# Patient Record
Sex: Female | Born: 1940 | Race: White | Hispanic: No | Smoking: Former smoker
Health system: Southern US, Community
[De-identification: ages and names within clinical notes are randomized; demographics above are authoritative.]

## PROBLEM LIST (undated history)

## (undated) DIAGNOSIS — K7290 Hepatic failure, unspecified without coma: Secondary | ICD-10-CM

## (undated) DIAGNOSIS — R011 Cardiac murmur, unspecified: Secondary | ICD-10-CM

## (undated) DIAGNOSIS — M1990 Unspecified osteoarthritis, unspecified site: Secondary | ICD-10-CM

## (undated) DIAGNOSIS — C801 Malignant (primary) neoplasm, unspecified: Secondary | ICD-10-CM

## (undated) DIAGNOSIS — F32A Depression, unspecified: Secondary | ICD-10-CM

## (undated) DIAGNOSIS — E119 Type 2 diabetes mellitus without complications: Secondary | ICD-10-CM

## (undated) DIAGNOSIS — F329 Major depressive disorder, single episode, unspecified: Secondary | ICD-10-CM

## (undated) DIAGNOSIS — K7682 Hepatic encephalopathy: Secondary | ICD-10-CM

## (undated) DIAGNOSIS — K7581 Nonalcoholic steatohepatitis (NASH): Secondary | ICD-10-CM

## (undated) DIAGNOSIS — N189 Chronic kidney disease, unspecified: Secondary | ICD-10-CM

## (undated) DIAGNOSIS — D696 Thrombocytopenia, unspecified: Secondary | ICD-10-CM

## (undated) DIAGNOSIS — E079 Disorder of thyroid, unspecified: Secondary | ICD-10-CM

## (undated) DIAGNOSIS — K7460 Unspecified cirrhosis of liver: Secondary | ICD-10-CM

## (undated) DIAGNOSIS — E039 Hypothyroidism, unspecified: Secondary | ICD-10-CM

## (undated) HISTORY — DX: Unspecified osteoarthritis, unspecified site: M19.90

## (undated) HISTORY — DX: Type 2 diabetes mellitus without complications: E11.9

## (undated) HISTORY — DX: Nonalcoholic steatohepatitis (NASH): K75.81

## (undated) HISTORY — DX: Unspecified cirrhosis of liver: K74.60

## (undated) HISTORY — DX: Hypothyroidism, unspecified: E03.9

## (undated) HISTORY — DX: Chronic kidney disease, unspecified: N18.9

## (undated) HISTORY — PX: THORACENTESIS: SHX235

## (undated) HISTORY — PX: TONSILLECTOMY: SUR1361

## (undated) HISTORY — DX: Malignant (primary) neoplasm, unspecified: C80.1

## (undated) HISTORY — PX: APPENDECTOMY: SHX54

## (undated) HISTORY — DX: Depression, unspecified: F32.A

## (undated) HISTORY — DX: Hepatic encephalopathy: K76.82

## (undated) HISTORY — PX: PARACENTESIS: SHX844

## (undated) HISTORY — DX: Disorder of thyroid, unspecified: E07.9

## (undated) HISTORY — PX: FOOT NEUROMA SURGERY: SHX646

## (undated) HISTORY — PX: PARTIAL HYSTERECTOMY: SHX80

## (undated) HISTORY — PX: CHOLECYSTECTOMY: SHX55

## (undated) HISTORY — DX: Hepatic failure, unspecified without coma: K72.90

## (undated) HISTORY — PX: TIPS PROCEDURE: SHX808

## (undated) HISTORY — DX: Thrombocytopenia, unspecified: D69.6

## (undated) HISTORY — PX: BACK SURGERY: SHX140

## (undated) HISTORY — DX: Cardiac murmur, unspecified: R01.1

---
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
## 2018-02-28 ENCOUNTER — Encounter: Payer: Self-pay | Admitting: Internal Medicine

## 2018-02-28 LAB — HM DIABETES EYE EXAM

## 2020-01-29 ENCOUNTER — Telehealth: Payer: Self-pay | Admitting: General Practice

## 2020-01-29 NOTE — Telephone Encounter (Signed)
Dr. Tamala Julian, I[place], clark, ellison or byrum

## 2020-01-29 NOTE — Telephone Encounter (Signed)
Ashlee Kelly stated that the case was urgent and nobody has a 30 min consult opening until later next week. Dr. Lamonte Sakai has some held spots for nodules. Could we place the pt in one of these slots? RB please advise.

## 2020-01-29 NOTE — Telephone Encounter (Signed)
Who should I schedule pt with? Please advise.

## 2020-01-29 NOTE — Telephone Encounter (Signed)
Spoke with Ashlee Kelly and she states pt needs urgent referral to be seen for pleural effusion. She is waiting for the provider to finish his office notes and she is going to fax all the information to [place].

## 2020-01-30 DIAGNOSIS — D689 Coagulation defect, unspecified: Secondary | ICD-10-CM | POA: Insufficient documentation

## 2020-01-30 DIAGNOSIS — G8929 Other chronic pain: Secondary | ICD-10-CM | POA: Insufficient documentation

## 2020-01-30 DIAGNOSIS — Z87891 Personal history of nicotine dependence: Secondary | ICD-10-CM | POA: Insufficient documentation

## 2020-01-30 DIAGNOSIS — J90 Pleural effusion, not elsewhere classified: Secondary | ICD-10-CM | POA: Insufficient documentation

## 2020-01-30 DIAGNOSIS — E119 Type 2 diabetes mellitus without complications: Secondary | ICD-10-CM | POA: Insufficient documentation

## 2020-01-30 DIAGNOSIS — D696 Thrombocytopenia, unspecified: Secondary | ICD-10-CM | POA: Insufficient documentation

## 2020-01-30 DIAGNOSIS — E039 Hypothyroidism, unspecified: Secondary | ICD-10-CM | POA: Insufficient documentation

## 2020-01-30 DIAGNOSIS — K7460 Unspecified cirrhosis of liver: Secondary | ICD-10-CM | POA: Insufficient documentation

## 2020-01-30 DIAGNOSIS — K589 Irritable bowel syndrome without diarrhea: Secondary | ICD-10-CM | POA: Insufficient documentation

## 2020-01-30 DIAGNOSIS — R188 Other ascites: Secondary | ICD-10-CM | POA: Insufficient documentation

## 2020-01-31 ENCOUNTER — Ambulatory Visit: Payer: Self-pay | Admitting: Internal Medicine

## 2020-02-04 NOTE — Telephone Encounter (Signed)
ATC patient to see if she wanted to come in tomorrow at 10am with Dr. Tamala Julian or if she wanted to keep her appointment with Dr. Carlis Abbott on the 11th unable to leave VM line just rang and then went to busy signal.  If patient calls back please see if she wants to come in sooner or keep appointment with Dr. Carlis Abbott on 3/11 at 3pm.

## 2020-02-05 MED ORDER — DM-GUAIFENESIN ER 30-600 MG PO TB12
1.00 | ORAL_TABLET | ORAL | Status: DC
Start: 2020-02-12 — End: 2020-02-05

## 2020-02-05 MED ORDER — IPRATROPIUM-ALBUTEROL 0.5-2.5 (3) MG/3ML IN SOLN
3.00 | RESPIRATORY_TRACT | Status: DC
Start: ? — End: 2020-02-05

## 2020-02-05 MED ORDER — BISACODYL 10 MG RE SUPP
10.00 | RECTAL | Status: DC
Start: ? — End: 2020-02-05

## 2020-02-05 MED ORDER — HYDRALAZINE HCL 10 MG PO TABS
10.00 | ORAL_TABLET | ORAL | Status: DC
Start: ? — End: 2020-02-05

## 2020-02-05 MED ORDER — DSS 100 MG PO CAPS
100.00 | ORAL_CAPSULE | ORAL | Status: DC
Start: ? — End: 2020-02-05

## 2020-02-05 MED ORDER — LEVOTHYROXINE SODIUM 75 MCG PO TABS
150.00 | ORAL_TABLET | ORAL | Status: DC
Start: 2020-02-12 — End: 2020-02-05

## 2020-02-05 MED ORDER — OXYCODONE HCL 5 MG PO TABS
5.00 | ORAL_TABLET | ORAL | Status: DC
Start: ? — End: 2020-02-05

## 2020-02-05 MED ORDER — INSULIN LISPRO 100 UNIT/ML ~~LOC~~ SOLN
2.00 | SUBCUTANEOUS | Status: DC
Start: 2020-02-12 — End: 2020-02-05

## 2020-02-05 MED ORDER — SODIUM CHLORIDE 0.9 % IV SOLN
10.00 | INTRAVENOUS | Status: DC
Start: ? — End: 2020-02-05

## 2020-02-05 MED ORDER — TORSEMIDE 20 MG PO TABS
20.00 | ORAL_TABLET | ORAL | Status: DC
Start: 2020-02-06 — End: 2020-02-05

## 2020-02-05 MED ORDER — HYDROCODONE-HOMATROPINE 5-1.5 MG/5ML PO SYRP
5.00 | ORAL_SOLUTION | ORAL | Status: DC
Start: ? — End: 2020-02-05

## 2020-02-05 MED ORDER — GENERIC EXTERNAL MEDICATION
Status: DC
Start: ? — End: 2020-02-05

## 2020-02-05 MED ORDER — ALBUTEROL SULFATE HFA 108 (90 BASE) MCG/ACT IN AERS
2.00 | INHALATION_SPRAY | RESPIRATORY_TRACT | Status: DC
Start: ? — End: 2020-02-05

## 2020-02-05 MED ORDER — CITALOPRAM HYDROBROMIDE 20 MG PO TABS
40.00 | ORAL_TABLET | ORAL | Status: DC
Start: 2020-02-06 — End: 2020-02-05

## 2020-02-05 MED ORDER — NITROGLYCERIN 0.4 MG SL SUBL
0.40 | SUBLINGUAL_TABLET | SUBLINGUAL | Status: DC
Start: ? — End: 2020-02-05

## 2020-02-05 MED ORDER — SPIRONOLACTONE 25 MG PO TABS
50.00 | ORAL_TABLET | ORAL | Status: DC
Start: 2020-02-06 — End: 2020-02-05

## 2020-02-05 MED ORDER — LIDOCAINE HCL 2 % IJ SOLN
10.00 | INTRAMUSCULAR | Status: DC
Start: 2020-02-05 — End: 2020-02-05

## 2020-02-05 MED ORDER — ALLOPURINOL 100 MG PO TABS
100.00 | ORAL_TABLET | ORAL | Status: DC
Start: 2020-02-13 — End: 2020-02-05

## 2020-02-05 MED ORDER — OXYCODONE HCL 10 MG PO TABS
10.00 | ORAL_TABLET | ORAL | Status: DC
Start: ? — End: 2020-02-05

## 2020-02-05 MED ORDER — AMLODIPINE BESYLATE 5 MG PO TABS
5.00 | ORAL_TABLET | ORAL | Status: DC
Start: 2020-02-13 — End: 2020-02-05

## 2020-02-05 NOTE — Telephone Encounter (Signed)
Pt is currently admitted with [facility], per [place]. Will await pts discharge to schedule appt.

## 2020-02-06 MED ORDER — GENERIC EXTERNAL MEDICATION
Status: DC
Start: ? — End: 2020-02-06

## 2020-02-06 MED ORDER — INSULIN GLARGINE 100 UNIT/ML ~~LOC~~ SOLN
10.00 | SUBCUTANEOUS | Status: DC
Start: 2020-02-06 — End: 2020-02-06

## 2020-02-06 MED ORDER — CARBAMIDE PEROXIDE 6.5 % OT SOLN
5.00 | OTIC | Status: DC
Start: 2020-02-12 — End: 2020-02-06

## 2020-02-07 ENCOUNTER — Institutional Professional Consult (permissible substitution): Payer: Medicare Other | Admitting: Critical Care Medicine

## 2020-02-07 NOTE — Telephone Encounter (Signed)
Called and spoke to pt. Pt states she is still currently admitted, Care Everywhere also reflects this. Pt states she wants to wait till she is discharged to make an appt. Will call pt in 2-3 days, as pt states she will be in for another few days. Will keep message open to follow up on.

## 2020-02-12 ENCOUNTER — Telehealth: Payer: Self-pay | Admitting: Critical Care Medicine

## 2020-02-12 MED ORDER — INSULIN GLARGINE 100 UNIT/ML ~~LOC~~ SOLN
20.00 | SUBCUTANEOUS | Status: DC
Start: 2020-02-12 — End: 2020-02-12

## 2020-02-12 MED ORDER — GENERIC EXTERNAL MEDICATION
Status: DC
Start: ? — End: 2020-02-12

## 2020-02-12 MED ORDER — SODIUM CHLORIDE 0.9 % IV SOLN
10.00 | INTRAVENOUS | Status: DC
Start: ? — End: 2020-02-12

## 2020-02-12 MED ORDER — HYDROMORPHONE HCL 1 MG/ML IJ SOLN
0.50 | INTRAMUSCULAR | Status: DC
Start: ? — End: 2020-02-12

## 2020-02-12 MED ORDER — LACTULOSE 10 GM/15ML PO SOLN
20.00 | ORAL | Status: DC
Start: 2020-02-12 — End: 2020-02-12

## 2020-02-12 MED ORDER — ONDANSETRON HCL 4 MG/2ML IJ SOLN
4.00 | INTRAMUSCULAR | Status: DC
Start: ? — End: 2020-02-12

## 2020-02-12 NOTE — Telephone Encounter (Signed)
While reviewing the chart, the patient was scheduled but it was cancelled because she was hospitalized. I called [facility] urgent care to let them know and spoke with [facility]. Nothing further is needed.

## 2020-02-15 DIAGNOSIS — I82A11 Acute embolism and thrombosis of right axillary vein: Secondary | ICD-10-CM | POA: Insufficient documentation

## 2020-03-11 DIAGNOSIS — I82A21 Chronic embolism and thrombosis of right axillary vein: Secondary | ICD-10-CM | POA: Insufficient documentation

## 2020-03-11 DIAGNOSIS — N184 Chronic kidney disease, stage 4 (severe): Secondary | ICD-10-CM | POA: Insufficient documentation

## 2020-03-11 DIAGNOSIS — D649 Anemia, unspecified: Secondary | ICD-10-CM | POA: Insufficient documentation

## 2020-03-11 DIAGNOSIS — R413 Other amnesia: Secondary | ICD-10-CM | POA: Insufficient documentation

## 2020-03-17 NOTE — Progress Notes (Signed)
Subjective:    Patient ID: Ashlee Kelly, female    DOB: 04-10-1941, 79 y.o.   MRN: [id number]  HPI  She is here to establish with a new pcp.  The patient is here for follow up of their chronic medical problems  She was admitted to [facility] for shortness of breath and cough on 3/2.  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  She had thoracentesis, pleuracentesis and a tips procedure.  She developed RUE DVT in subclavian, axillary, brachial veins from a PICC line.  She saw hematology and a/c not needed due to high risk from liver dx, coagulopathy and thrombocytopenia.    She was discharged to rehab and went home two days ago.    She has gained 4 lbs since leaving rehab.  She feels SOB at rest and with activity.  She feels more SOB today.    Her right arm is weeping clear fluid and is swollen.  The swelling has improved. This all started after the dvt.         Medications and allergies reviewed with patient and updated if appropriate.  Patient Active Problem List   Diagnosis Date Noted  . History of gout 03/18/2020  . Anxiety and depression 03/18/2020  . SOB (shortness of breath) 03/18/2020  . CKD (chronic kidney disease) stage 4, GFR 15-29 ml/min (HCC) 03/11/2020  . DVT of axillary vein, chronic right ([place]) 03/11/2020  . Memory deficit 03/11/2020  . Acute deep vein thrombosis (DVT) of axillary vein of right upper extremity ([place]) 02/15/2020  . Chronic bilateral low back pain 01/30/2020  . Coagulopathy ([place]) 01/30/2020  . History of tobacco abuse 01/30/2020  . Hypothyroidism 01/30/2020  . IBS (irritable bowel syndrome) 01/30/2020  . Cirrhosis of liver with ascites ([place]) 01/30/2020  . Type 2 diabetes mellitus, without long-term current use of insulin ([place]) 01/30/2020  . Pleural effusion on right 01/30/2020    Current Outpatient Medications on File Prior to Visit  Medication Sig Dispense Refill  . allopurinol (ZYLOPRIM) 100 MG tablet Take 100 mg by mouth  daily.    . ciprofloxacin (CIPRO) 250 MG tablet Take 250 mg by mouth 2 (two) times daily.    . insulin glargine (LANTUS) 100 UNIT/ML injection Inject 30 Units into the skin daily. 30 units at night    . lactulose (CEPHULAC) 10 g packet Take 10 g by mouth 3 (three) times daily.    Marland Kitchen levothyroxine (SYNTHROID) 150 MCG tablet Take 150 mcg by mouth daily before breakfast.    . spironolactone (ALDACTONE) 50 MG tablet Take 50 mg by mouth daily.    Marland Kitchen torsemide (DEMADEX) 20 MG tablet Take 20 mg by mouth daily.     No current facility-administered medications on file prior to visit.    History reviewed. No pertinent past medical history.  History reviewed. No pertinent surgical history.  Social History   Socioeconomic History  . Marital status: Unknown    Spouse name: Not on file  . Number of children: Not on file  . Years of education: Not on file  . Highest education level: Not on file  Occupational History  . Not on file  Tobacco Use  . Smoking status: Former Research scientist (life sciences)  . Smokeless tobacco: Never Used  Substance and Sexual Activity  . Alcohol use: Not Currently  . Drug use: Not Currently  . Sexual activity: Not on file  Other Topics Concern  . Not on file  Social History Narrative  .  Not on file   Social Determinants of Health   Financial Resource Strain:   . Difficulty of Paying Living Expenses:   Food Insecurity:   . Worried About Charity fundraiser in the Last Year:   . Arboriculturist in the Last Year:   Transportation Needs:   . Film/video editor (Medical):   Marland Kitchen Lack of Transportation (Non-Medical):   Physical Activity:   . Days of Exercise per Week:   . Minutes of Exercise per Session:   Stress:   . Feeling of Stress :   Social Connections:   . Frequency of Communication with Friends and Family:   . Frequency of Social Gatherings with Friends and Family:   . Attends Religious Services:   . Active Member of Clubs or Organizations:   . Attends Theatre manager Meetings:   Marland Kitchen Marital Status:     History reviewed. No pertinent family history.  Review of Systems  Constitutional: Positive for appetite change (decreased). Negative for chills and fever.  Respiratory: Positive for cough (dry) and shortness of breath. Negative for wheezing.   Cardiovascular: Positive for leg swelling. Negative for chest pain and palpitations.  Gastrointestinal: Positive for abdominal pain, diarrhea and nausea.  Neurological: Positive for dizziness, light-headedness and numbness (toes). Negative for headaches.  Psychiatric/Behavioral: Positive for dysphoric mood and sleep disturbance. The patient is nervous/anxious.        Objective:   Vitals:   03/18/20 1304  BP: 124/62  Pulse: (!) 103  Resp: 14  Temp: 98.7 F (37.1 C)  SpO2: 96%   BP Readings from Last 3 Encounters:  03/18/20 124/62   Wt Readings from Last 3 Encounters:  03/18/20 158 lb 9.6 oz (71.9 kg)   Body mass index is 25.6 kg/m.   Physical Exam    Constitutional: Appears well-developed and well-nourished. No distress.  HENT:  Head: Normocephalic and atraumatic.  Neck: Neck supple. No tracheal deviation present. No thyromegaly present.  No cervical lymphadenopathy Cardiovascular: Normal rate, regular rhythm and normal heart sounds.  3/6 systolic murmur heard.  Mild b/l LE edema, mild edema RUE, minimal leakage of clear fluid lower right arm Pulmonary/Chest: Effort normal.  Decreased breath sounds entire right lung field.  No respiratory distress. No has no wheezes. No rales.  Abdomen: soft, distended, diffusely tender w/o rebound or guarding Skin: Skin is warm and dry. Not diaphoretic.  Psychiatric: Normal mood and affect. Behavior is normal.      Assessment & Plan:   55 minutes were spent face-to-face with the patient and her son, reviewing her chart, medical history, numerous medical problems and coordinating care.      See Problem List for Assessment and Plan of chronic  medical problems.    This visit occurred during the SARS-CoV-2 public health emergency.  Safety protocols were in place, including screening questions prior to the visit, additional usage of staff PPE, and extensive cleaning of exam room while observing appropriate contact time as indicated for disinfecting solutions.

## 2020-03-18 ENCOUNTER — Encounter: Payer: Self-pay | Admitting: Internal Medicine

## 2020-03-18 ENCOUNTER — Ambulatory Visit (INDEPENDENT_AMBULATORY_CARE_PROVIDER_SITE_OTHER): Payer: Medicare Other

## 2020-03-18 ENCOUNTER — Ambulatory Visit (INDEPENDENT_AMBULATORY_CARE_PROVIDER_SITE_OTHER): Payer: Medicare Other | Admitting: Internal Medicine

## 2020-03-18 ENCOUNTER — Other Ambulatory Visit: Payer: Self-pay

## 2020-03-18 DIAGNOSIS — Z8739 Personal history of other diseases of the musculoskeletal system and connective tissue: Secondary | ICD-10-CM

## 2020-03-18 DIAGNOSIS — I340 Nonrheumatic mitral (valve) insufficiency: Secondary | ICD-10-CM | POA: Insufficient documentation

## 2020-03-18 DIAGNOSIS — F419 Anxiety disorder, unspecified: Secondary | ICD-10-CM

## 2020-03-18 DIAGNOSIS — J90 Pleural effusion, not elsewhere classified: Secondary | ICD-10-CM

## 2020-03-18 DIAGNOSIS — K7581 Nonalcoholic steatohepatitis (NASH): Secondary | ICD-10-CM

## 2020-03-18 DIAGNOSIS — R0602 Shortness of breath: Secondary | ICD-10-CM

## 2020-03-18 DIAGNOSIS — I82A21 Chronic embolism and thrombosis of right axillary vein: Secondary | ICD-10-CM

## 2020-03-18 DIAGNOSIS — N184 Chronic kidney disease, stage 4 (severe): Secondary | ICD-10-CM

## 2020-03-18 DIAGNOSIS — E039 Hypothyroidism, unspecified: Secondary | ICD-10-CM

## 2020-03-18 DIAGNOSIS — Z95828 Presence of other vascular implants and grafts: Secondary | ICD-10-CM | POA: Insufficient documentation

## 2020-03-18 DIAGNOSIS — F329 Major depressive disorder, single episode, unspecified: Secondary | ICD-10-CM

## 2020-03-18 DIAGNOSIS — I350 Nonrheumatic aortic (valve) stenosis: Secondary | ICD-10-CM | POA: Insufficient documentation

## 2020-03-18 DIAGNOSIS — K7460 Unspecified cirrhosis of liver: Secondary | ICD-10-CM

## 2020-03-18 DIAGNOSIS — K589 Irritable bowel syndrome without diarrhea: Secondary | ICD-10-CM

## 2020-03-18 MED ORDER — SERTRALINE HCL 50 MG PO TABS: 50.0000 mg | ORAL_TABLET | Freq: Every day | ORAL | 5 refills | Status: DC

## 2020-03-18 NOTE — Assessment & Plan Note (Signed)
Acute With residual RUE edema and clear fluid leakage from lower right arm Heme consulted - no a/c due to liver dx, coagulopathy and thrombocytopenia There has been some improvement in the edema

## 2020-03-18 NOTE — Assessment & Plan Note (Signed)
Chronic Taking allopurinol daily No gout symptoms since start allopurinol continue

## 2020-03-18 NOTE — Assessment & Plan Note (Signed)
Chronic Stable Would like to establish with nephrology in [place] - referred to [facility]

## 2020-03-18 NOTE — Assessment & Plan Note (Addendum)
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

## 2020-03-18 NOTE — Patient Instructions (Addendum)
  A chest xray was ordered.     Medications reviewed and updated.  Changes include :   Decrease celexa to 20 mg daily for 5 days and then stop. Start sertraline 50 mg at night.   Your prescription(s) have been submitted to your pharmacy. Please take as directed and contact our office if you believe you are having problem(s) with the medication(s).  A referral was ordered for  GI, pulmonary, kidney doctor.     Someone will call you to schedule this.    Please followup in 1 month

## 2020-03-18 NOTE — Assessment & Plan Note (Signed)
Related to liver cirrhosis Recurrent S/p tips procedure 01/2020 Decreased BS entire right lung, increased SOB in past 24 hrs CXR today

## 2020-03-18 NOTE — Assessment & Plan Note (Signed)
Chronic Continue current dose of levothyroxine

## 2020-03-18 NOTE — Assessment & Plan Note (Signed)
Chronic Taking lantus 30 units HS Sugars controlled Continue above

## 2020-03-18 NOTE — Assessment & Plan Note (Addendum)
Chronic With diarrhea Typically has loose BMs daily

## 2020-03-18 NOTE — Assessment & Plan Note (Signed)
Acute Increased since leaving rehab ? Reacummulation of pleural effusion - decreased BS entire right lung CXR today

## 2020-03-19 ENCOUNTER — Ambulatory Visit: Payer: Medicare Other | Admitting: Nurse Practitioner

## 2020-03-19 ENCOUNTER — Encounter: Payer: Self-pay | Admitting: Internal Medicine

## 2020-03-19 MED ORDER — PAROXETINE HCL 20 MG PO TABS: 20.0000 mg | ORAL_TABLET | Freq: Every day | ORAL | 5 refills | Status: DC

## 2020-03-20 ENCOUNTER — Ambulatory Visit (INDEPENDENT_AMBULATORY_CARE_PROVIDER_SITE_OTHER): Payer: Medicare Other | Admitting: Nurse Practitioner

## 2020-03-20 ENCOUNTER — Other Ambulatory Visit: Payer: Self-pay

## 2020-03-20 ENCOUNTER — Encounter: Payer: Self-pay | Admitting: *Deleted

## 2020-03-20 ENCOUNTER — Telehealth: Payer: Self-pay | Admitting: Internal Medicine

## 2020-03-20 ENCOUNTER — Telehealth: Payer: Self-pay | Admitting: Emergency Medicine

## 2020-03-20 ENCOUNTER — Encounter: Payer: Self-pay | Admitting: Nurse Practitioner

## 2020-03-20 VITALS — BP 132/74 | HR 99 | Temp 96.8°F | Ht 66.0 in | Wt 160.4 lb

## 2020-03-20 DIAGNOSIS — K7460 Unspecified cirrhosis of liver: Secondary | ICD-10-CM | POA: Diagnosis not present

## 2020-03-20 DIAGNOSIS — D696 Thrombocytopenia, unspecified: Secondary | ICD-10-CM

## 2020-03-20 DIAGNOSIS — R188 Other ascites: Secondary | ICD-10-CM

## 2020-03-20 DIAGNOSIS — K766 Portal hypertension: Secondary | ICD-10-CM | POA: Diagnosis not present

## 2020-03-20 MED ORDER — RIFAXIMIN 550 MG PO TABS: 550.0000 mg | ORAL_TABLET | Freq: Two times a day (BID) | ORAL | 0 refills | Status: DC

## 2020-03-20 MED ORDER — ONDANSETRON HCL 4 MG PO TABS: 4.0000 mg | ORAL_TABLET | Freq: Three times a day (TID) | ORAL | 2 refills | Status: AC | PRN

## 2020-03-20 MED ORDER — HYDROXYZINE HCL 10 MG PO TABS: 10.0000 mg | ORAL_TABLET | Freq: Every evening | ORAL | 0 refills | Status: DC | PRN

## 2020-03-20 NOTE — Telephone Encounter (Signed)
Pt was seen today by EG. Her son, Laverna Peace, called this afternoon to let EG know that her prescription after Insurance would be over $600 and was there anything else we could recommend. Please advise. [phone]

## 2020-03-20 NOTE — Telephone Encounter (Signed)
Called medicare and pt will need to give info from her RX savings card. Spoke with pts son. RXBIN [id number], PCN [id number], GP RXMEDDI ID [id number]. I called the number pts son gave [phone] and it was the wrong number. I was transferred several times and given the number [phone]. I will try this number. Pts medication was approved and still cost $600.00. I'm trying to get a tier reduction.

## 2020-03-20 NOTE — Telephone Encounter (Signed)
PA approved  rx number [id number] Pt notified

## 2020-03-20 NOTE — Patient Instructions (Signed)
Your health issues we discussed today were:   Cirrhosis and effects of cirrhosis: 1. I have sent Atarax 10 mg to your pharmacy.  Take this every evening, if needed for itching 2. Have your labs drawn when you are able to 3. We will help schedule your ultrasound your liver for you 4. I sent a prescription for Xifaxan (rifaximin) 550 mg to your pharmacy.  Take this twice a day to prevent confusion 5. While we are trying to get Xifaxan approved, continue using lactulose as well as you are able to with your loose stools 6. I will discuss with Dr. Gala Romney if we can proceed with an upper endoscopy to screen for swollen blood vessels in your swallowing tube 7. Call us if you have any worsening or significant symptoms  Nausea: 1. I have sent a prescription for Zofran 4 mg to your pharmacy.  Take this every 8 hours, if needed for nausea 2. Call us if you have any worsening or significant nausea with or without vomiting  Overall I recommend:  1. Continue your other current medications 2. Return for follow-up in 3 months 3. Call us if you have any questions or concerns   ---------------------------------------------------------------  COVID-19 Vaccine Information can be found at: ShippingScam.co.uk For questions related to vaccine distribution or appointments, please email vaccine@[email or web address] .com or call 5034984662.   ---------------------------------------------------------------   At [facility] we value your feedback. You may receive a survey about your visit today. Please share your experience as we strive to create trusting relationships with our patients to provide genuine, compassionate, quality care.  We appreciate your understanding and patience as we review any laboratory studies, imaging, and other diagnostic tests that are ordered as we care for you. Our office policy is 5 business days for review of these  results, and any emergent or urgent results are addressed in a timely manner for your best interest. If you do not hear from our office in 1 week, please contact us.   We also encourage the use of MyChart, which contains your medical information for your review as well. If you are not enrolled in this feature, an access code is on this after visit summary for your convenience. Thank you for allowing [place] to be involved in your care.  It was great to see you today!  I hope you have a great Summer!!

## 2020-03-20 NOTE — Progress Notes (Signed)
Primary Care Physician:  Binnie Rail, MD Primary Gastroenterologist:  Dr. Gala Romney  Chief Complaint  Patient presents with  . varices    HPI:   Ashlee Kelly is a 79 y.o. 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  Reviewed information provided with referral including a recent office visit with a newly established primary care on 03/18/2020.  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  Thoracentesis and pleurocentesis as well as TIPS procedure performed.  Developed DVT in the process in the right upper extremity from a PICC line.  Hematology recommended no anticoagulation due to high risk from liver disease, coagulopathy, and thrombocytopenia.  She was discharged to rehab.  She has gained 4 pounds since leaving rehab and has shortness of breath at rest.  Right upper extremity swelling improved.  Questioning of acute worsening/reaccumulation of pleural effusion and recommended chest x-ray.  Noted chronic kidney disease and recommended establishment with a nephrologist in [place].  Also noted chronic IBS with loose BMs daily.  TIPS procedure performed 01/2020.  Noted some improvement in DVT.  Regarding cirrhosis, noted to be chronic due to [place] with ascites.  On Cipro for SBP prophylaxis.  On lactulose 3 times daily for 3-4 stools a day, no issues with ammonia.  Noted GI appointment this week but would ideally like to see somebody at [facility].  Chest x-ray showed moderate to large right-sided pleural effusion.  Noted to be seen pulmonary this week.  The patient had an office visit 03/19/2020 for TIPS evaluation, noted to be patent.  Recommended follow-up in 4 months for reevaluation of TIPS to ensure patency.  No history of colonoscopy or endoscopy in our system.  Today she states she's doing ok overall. She is accompanied by her son Laverna Peace. She has known she has cirrhosis for about 6 years. Previously under the care of  Dr. Posey Pronto in [place], [place]. Moved to [place] early this year. She was previously seen every 6 months for imaging and labs. States he told her her liver disease was doing ok, not significantly worse. Went to [facility] 01/30/20 for dyspnea. Had fluid removed from around her lungs. Has had a paracentesis bout 2 years ago in [place]. She has had some minimal edema in LE, some mild swelling across her abdomen. Denies overt confusion other than some recall issues. She is on lactulose but has paused this a few days ago due to loose stools. Her son notes a lot of confusion during hospitalization, but was doing ok before and after hospitalization; he feels she's at mental status baseline now. Felt antidepressant was causing some of the issues. Has some intermittent abdominal pain ("if you push on it.") Has regular nausea but no vomiting. Denies hematochezia (although states it "almost looked like blood one or two times a few days ago, but none since"). States she had bad pruritis during hospitalization, somewhat improved after discharge but persistent to an extent. Denies melena, fever, chills, unintentional weight loss. Denies yellowing of skin/eyes, darkened urine, tremors/shakes.  States labs were just checked 2 days ago by Nephrology.   Of note, a total of 60-75 minutes were personally spent in the care of the patient today  Past Medical History:  Diagnosis Date  . Arthritis   . Cancer ([place])   . Chronic kidney disease   . Cirrhosis ([place])   . Depression   . Diabetes ([place])   . Heart murmur   .  Hepatic encephalopathy ([place])   . Hypothyroidism   . NASH (nonalcoholic steatohepatitis)   . Thrombocytopenia ([place])   . Thyroid disease     Past Surgical History:  Procedure Laterality Date  . APPENDECTOMY    . BACK SURGERY     x3  . CHOLECYSTECTOMY    . FOOT NEUROMA SURGERY Bilateral   . PARACENTESIS    . PARTIAL HYSTERECTOMY    . THORACENTESIS    . TIPS PROCEDURE    . TONSILLECTOMY      Current Outpatient  Medications  Medication Sig Dispense Refill  . allopurinol (ZYLOPRIM) 100 MG tablet Take 100 mg by mouth daily.    . ciprofloxacin (CIPRO) 250 MG tablet Take 250 mg by mouth daily with breakfast.     . insulin glargine (LANTUS) 100 UNIT/ML injection Inject 30 Units into the skin daily. 30 units at night    . lactulose (CEPHULAC) 10 g packet Take 10 g by mouth daily. Hasn't taken since 03/15/20 d/t loose stool    . levothyroxine (SYNTHROID) 150 MCG tablet Take 150 mcg by mouth daily before breakfast.    . PARoxetine (PAXIL) 20 MG tablet Take 1 tablet (20 mg total) by mouth daily. 30 tablet 5  . spironolactone (ALDACTONE) 50 MG tablet Take 50 mg by mouth daily.    Marland Kitchen torsemide (DEMADEX) 20 MG tablet Take 20 mg by mouth daily.    . hydrOXYzine (ATARAX/VISTARIL) 10 MG tablet Take 1 tablet (10 mg total) by mouth at bedtime as needed for itching. 30 tablet 0  . ondansetron (ZOFRAN) 4 MG tablet Take 1 tablet (4 mg total) by mouth every 8 (eight) hours as needed for nausea. 90 tablet 2   No current facility-administered medications for this visit.    Allergies as of 03/20/2020 - Review Complete 03/18/2020  Allergen Reaction Noted  . Celebrex [celecoxib]  03/18/2020    Family History  Problem Relation Age of Onset  . Lung cancer Father     Social History   Socioeconomic History  . Marital status: Widowed    Spouse name: Not on file  . Number of children: Not on file  . Years of education: Not on file  . Highest education level: Not on file  Occupational History  . Not on file  Tobacco Use  . Smoking status: Current Some Day Smoker    Types: Cigarettes  . Smokeless tobacco: Never Used  . Tobacco comment: Pt. states that she smokes 1 cigg. occ.   Substance and Sexual Activity  . Alcohol use: Not Currently  . Drug use: Not Currently  . Sexual activity: Not on file  Other Topics Concern  . Not on file  Social History Narrative  . Not on file   Social Determinants of Health    Financial Resource Strain:   . Difficulty of Paying Living Expenses:   Food Insecurity:   . Worried About Charity fundraiser in the Last Year:   . Arboriculturist in the Last Year:   Transportation Needs:   . Film/video editor (Medical):   Marland Kitchen Lack of Transportation (Non-Medical):   Physical Activity:   . Days of Exercise per Week:   . Minutes of Exercise per Session:   Stress:   . Feeling of Stress :   Social Connections:   . Frequency of Communication with Friends and Family:   . Frequency of Social Gatherings with Friends and Family:   . Attends Religious Services:   . Active  Member of Clubs or Organizations:   . Attends Archivist Meetings:   Marland Kitchen Marital Status:   Intimate Partner Violence:   . Fear of Current or Ex-Partner:   . Emotionally Abused:   Marland Kitchen Physically Abused:   . Sexually Abused:     Subjective: Review of Systems  Constitutional: Negative for chills, fever, malaise/fatigue and weight loss.  HENT: Negative for congestion and sore throat.   Respiratory: Negative for cough and shortness of breath.   Cardiovascular: Negative for chest pain and palpitations.  Gastrointestinal: Negative for abdominal pain, blood in stool, diarrhea, melena, nausea and vomiting.  Musculoskeletal: Negative for joint pain and myalgias.  Skin: Negative for rash.  Neurological: Negative for dizziness and weakness.  Endo/Heme/Allergies: Does not bruise/bleed easily.  Psychiatric/Behavioral: Negative for depression. The patient is not nervous/anxious.   All other systems reviewed and are negative.      Objective: BP 132/74   Pulse 99   Temp (!) 96.8 F (36 C) (Temporal)   Ht 5' 6"  (1.676 m)   Wt 160 lb 6.4 oz (72.8 kg)   BMI 25.89 kg/m  Physical Exam Vitals and nursing note reviewed.  Constitutional:      General: She is not in acute distress.    Appearance: Normal appearance. She is well-developed. She is not ill-appearing, toxic-appearing or diaphoretic.   HENT:     Head: Normocephalic and atraumatic.     Nose: No congestion or rhinorrhea.  Eyes:     General: No scleral icterus. Cardiovascular:     Rate and Rhythm: Normal rate and regular rhythm.     Heart sounds: Normal heart sounds.  Pulmonary:     Effort: Pulmonary effort is normal. No respiratory distress.     Breath sounds: Normal breath sounds.  Abdominal:     General: Bowel sounds are normal.     Palpations: Abdomen is soft. There is no hepatomegaly, splenomegaly or mass.     Tenderness: There is no abdominal tenderness. There is no guarding or rebound.     Hernia: No hernia is present.  Skin:    General: Skin is warm and dry.     Coloration: Skin is not jaundiced.     Findings: No rash.  Neurological:     General: No focal deficit present.     Mental Status: She is alert and oriented to person, place, and time.  Psychiatric:        Attention and Perception: Attention normal.        Mood and Affect: Mood normal.        Speech: Speech normal.        Behavior: Behavior normal.        Thought Content: Thought content normal.        Cognition and Memory: Cognition and memory normal.       03/30/2020 9:30 PM   Disclaimer: This note was dictated with voice recognition software. Similar sounding words can inadvertently be transcribed and may not be corrected upon review.

## 2020-03-21 ENCOUNTER — Ambulatory Visit (INDEPENDENT_AMBULATORY_CARE_PROVIDER_SITE_OTHER): Payer: Medicare Other | Admitting: Pulmonary Disease

## 2020-03-21 ENCOUNTER — Encounter: Payer: Self-pay | Admitting: Pulmonary Disease

## 2020-03-21 VITALS — BP 130/80 | HR 57 | Ht 66.0 in | Wt 160.0 lb

## 2020-03-21 DIAGNOSIS — R0602 Shortness of breath: Secondary | ICD-10-CM

## 2020-03-21 DIAGNOSIS — J90 Pleural effusion, not elsewhere classified: Secondary | ICD-10-CM

## 2020-03-21 LAB — CBC WITH DIFFERENTIAL/PLATELET
Absolute Monocytes: 400 cells/uL (ref 200–950)
Basophils Absolute: 62 cells/uL (ref 0–200)
Basophils Relative: 1.2 %
Eosinophils Absolute: 229 cells/uL (ref 15–500)
Eosinophils Relative: 4.4 %
HCT: 29.7 % — ABNORMAL LOW (ref 35.0–45.0)
Hemoglobin: 10.2 g/dL — ABNORMAL LOW (ref 11.7–15.5)
Lymphs Abs: 842 cells/uL — ABNORMAL LOW (ref 850–3900)
MCH: 32.1 pg (ref 27.0–33.0)
MCHC: 34.3 g/dL (ref 32.0–36.0)
MCV: 93.4 fL (ref 80.0–100.0)
MPV: 10.7 fL (ref 7.5–12.5)
Monocytes Relative: 7.7 %
Neutro Abs: 3666 cells/uL (ref 1500–7800)
Neutrophils Relative %: 70.5 %
Platelets: 58 10*3/uL — ABNORMAL LOW (ref 140–400)
RBC: 3.18 10*6/uL — ABNORMAL LOW (ref 3.80–5.10)
RDW: 16.3 % — ABNORMAL HIGH (ref 11.0–15.0)
Total Lymphocyte: 16.2 %
WBC: 5.2 10*3/uL (ref 3.8–10.8)

## 2020-03-21 LAB — COMPREHENSIVE METABOLIC PANEL
AG Ratio: 1 (calc) (ref 1.0–2.5)
ALT: 30 U/L — ABNORMAL HIGH (ref 6–29)
AST: 47 U/L — ABNORMAL HIGH (ref 10–35)
Albumin: 2.6 g/dL — ABNORMAL LOW (ref 3.6–5.1)
Alkaline phosphatase (APISO): 123 U/L (ref 37–153)
BUN/Creatinine Ratio: 20 (calc) (ref 6–22)
BUN: 35 mg/dL — ABNORMAL HIGH (ref 7–25)
CO2: 23 mmol/L (ref 20–32)
Calcium: 8.6 mg/dL (ref 8.6–10.4)
Chloride: 109 mmol/L (ref 98–110)
Creat: 1.76 mg/dL — ABNORMAL HIGH (ref 0.60–0.93)
Globulin: 2.7 g/dL (calc) (ref 1.9–3.7)
Glucose, Bld: 155 mg/dL — ABNORMAL HIGH (ref 65–99)
Potassium: 4 mmol/L (ref 3.5–5.3)
Sodium: 142 mmol/L (ref 135–146)
Total Bilirubin: 4.3 mg/dL — ABNORMAL HIGH (ref 0.2–1.2)
Total Protein: 5.3 g/dL — ABNORMAL LOW (ref 6.1–8.1)

## 2020-03-21 LAB — PROTIME-INR
INR: 1.5 — ABNORMAL HIGH
Prothrombin Time: 15.4 s — ABNORMAL HIGH (ref 9.0–11.5)

## 2020-03-21 LAB — AFP TUMOR MARKER: AFP-Tumor Marker: 0.9 ng/mL

## 2020-03-21 NOTE — Patient Instructions (Addendum)
You has some reaccumulation of fluid on the right due to cirrhosis We can drain it but it will reaccumulate again I will check with your kidney doctor to see if we can increase your diuretic dose If his breathing worsens then we will need to go to the emergency room for drainage  Follow-up in 1 month with chest x-ray.

## 2020-03-21 NOTE — Progress Notes (Addendum)
Ashlee Kelly    [id number]    12/24/1940  Primary Care Physician:Burns, Claudina Lick, MD  Referring Physician: Lysbeth Penner, [address] [place],  [place] [address]  Chief complaint: Consult for right pleural effusion.  HPI: 79 year old with chronic kidney disease, Nash cirrhosis.  Previously followed at [place] Admitted to [facility] for dyspnea on 01/29/2020 with large right effusion, hepatic hydrothorax.  She underwent multiple thoracentesis and ultimately TIPS procedure performed on 3/15.  Hospitalization also notable for DVT in the right extremity from PICC line.  Hematology consulted and no anticoagulation recommended due to high risk of bleeding from liver disease, coagulopathy and thrombocytopenia.  Has been discharged to rehab.  Notes increasing dyspnea, lower extremity edema recently.  She had a chest x-ray performed at her primary care office on 4/20 which shows moderate right effusion.  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  Currently on Aldactone and torsemide.  Son tells me that in [place] renal function worsened every time diuretics were increased.  Nephrologist- Dr. Dorathy Kinsman MD, [facility] NP, [facility]  Outpatient Encounter Medications as of 03/21/2020  Medication Sig  . allopurinol (ZYLOPRIM) 100 MG tablet Take 100 mg by mouth daily.  . ciprofloxacin (CIPRO) 250 MG tablet Take 250 mg by mouth daily with breakfast.   . citalopram (CELEXA) 10 MG tablet Take 10 mg by mouth daily.   . hydrOXYzine (ATARAX/VISTARIL) 10 MG tablet Take 1 tablet (10 mg total) by mouth at bedtime as needed for itching.  . insulin glargine (LANTUS) 100 UNIT/ML injection Inject 30 Units into the skin daily. 30 units at night  . lactulose (CEPHULAC) 10 g packet Take 10 g by mouth daily. Hasn't taken since 03/15/20 d/t loose stool  . levothyroxine (SYNTHROID) 150 MCG tablet Take 150 mcg by mouth  daily before breakfast.  . ondansetron (ZOFRAN) 4 MG tablet Take 1 tablet (4 mg total) by mouth every 8 (eight) hours as needed for nausea.  Marland Kitchen PARoxetine (PAXIL) 20 MG tablet Take 1 tablet (20 mg total) by mouth daily.  Marland Kitchen spironolactone (ALDACTONE) 50 MG tablet Take 50 mg by mouth daily.  Marland Kitchen torsemide (DEMADEX) 20 MG tablet Take 20 mg by mouth daily.  . [DISCONTINUED] rifaximin (XIFAXAN) 550 MG TABS tablet Take 1 tablet (550 mg total) by mouth 2 (two) times daily.   No facility-administered encounter medications on file as of 03/21/2020.    Allergies as of 03/21/2020 - Review Complete 03/21/2020  Allergen Reaction Noted  . Celebrex [celecoxib]  03/18/2020    History reviewed. No pertinent past medical history.  Past Surgical History:  Procedure Laterality Date  . APPENDECTOMY    . BACK SURGERY     x3  . CHOLECYSTECTOMY    . FOOT NEUROMA SURGERY Bilateral   . PARTIAL HYSTERECTOMY    . TONSILLECTOMY      Family History  Problem Relation Age of Onset  . Lung cancer Father     Social History   Socioeconomic History  . Marital status: Widowed    Spouse name: Not on file  . Number of children: Not on file  . Years of education: Not on file  . Highest education level: Not on file  Occupational History  . Not on file  Tobacco Use  . Smoking status: Current Some Day Smoker    Types: Cigarettes  . Smokeless tobacco: Never Used  . Tobacco comment: Pt. states  that she smokes 1 cigg. occ.   Substance and Sexual Activity  . Alcohol use: Not Currently  . Drug use: Not Currently  . Sexual activity: Not on file  Other Topics Concern  . Not on file  Social History Narrative  . Not on file   Social Determinants of Health   Financial Resource Strain:   . Difficulty of Paying Living Expenses:   Food Insecurity:   . Worried About Charity fundraiser in the Last Year:   . Arboriculturist in the Last Year:   Transportation Needs:   . Film/video editor (Medical):   Marland Kitchen Lack  of Transportation (Non-Medical):   Physical Activity:   . Days of Exercise per Week:   . Minutes of Exercise per Session:   Stress:   . Feeling of Stress :   Social Connections:   . Frequency of Communication with Friends and Family:   . Frequency of Social Gatherings with Friends and Family:   . Attends Religious Services:   . Active Member of Clubs or Organizations:   . Attends Archivist Meetings:   Marland Kitchen Marital Status:   Intimate Partner Violence:   . Fear of Current or Ex-Partner:   . Emotionally Abused:   Marland Kitchen Physically Abused:   . Sexually Abused:     Review of systems: Review of Systems  Constitutional: Negative for fever and chills.  HENT: Negative.   Eyes: Negative for blurred vision.  Respiratory: as per HPI  Cardiovascular: Negative for chest pain and palpitations.  Gastrointestinal: Negative for vomiting, diarrhea, blood per rectum. Genitourinary: Negative for dysuria, urgency, frequency and hematuria.  Musculoskeletal: Negative for myalgias, back pain and joint pain.  Skin: Negative for itching and rash.  Neurological: Negative for dizziness, tremors, focal weakness, seizures and loss of consciousness.  Endo/Heme/Allergies: Negative for environmental allergies.  Psychiatric/Behavioral: Negative for depression, suicidal ideas and hallucinations.  All other systems reviewed and are negative.  Physical Exam: Blood pressure 130/80, pulse (!) 57, height 5' 6"  (1.676 m), weight 160 lb (72.6 kg), SpO2 99 %. Gen:      No acute distress HEENT:  EOMI, sclera anicteric Neck:     No masses; no thyromegaly Lungs:    Diminished breath sounds on the right CV:         Regular rate and rhythm; no murmurs Abd:      + bowel sounds; soft, non-tender; no palpable masses, no distension Ext:    4+ edema; adequate peripheral perfusion Skin:      Warm and dry; no rash Neuro: Awake, oriented.  Slow to respond to questions.  Data Reviewed: Imaging: CTA 01/30/2020-large right  effusion with complete collapse of the right lung.  No pulmonary embolism Chest x-ray 03/18/2020-moderate-large right effusion with some aeration of the upper lobe. I have reviewed the images personally.  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 Would not recommend immediate drainage as it would reaccumulate.  Besides patient does not want to have any invasive procedures right now.  Hopefully her TIPS procedure will keep the pleural effusion under control going forward. For breathing worsens then she will either go to the ER for drainage or will schedule thoracentesis for temporary relief  She appears grossly edematous and volume overloaded with lower extremity edema.  I will check with her nephrologist if we can  increase her diuretic dose. She may be a candidate for Pleurx catheter but would recommend it only if she decides to go to hospice as repeated drainage will cause protein loss, malnutrition. Discussed in detail with patient and son in office today  She did not desat on exertion today.  She may benefit from supplemental oxygen at night We will check nocturnal oximetry  Plan/Recommendations: - Overnight oximetry - Consider increasing diuretic dose.  This appointment required 50 minutes of patient care (this includes precharting, chart review, review of results, face-to-face care, etc.).  Marshell Garfinkel MD [facility] 03/21/2020, 10:28 AM  CC: Lysbeth Penner, FNP

## 2020-03-25 ENCOUNTER — Institutional Professional Consult (permissible substitution): Payer: Medicare Other | Admitting: Pulmonary Disease

## 2020-03-25 ENCOUNTER — Encounter: Payer: Self-pay | Admitting: Internal Medicine

## 2020-03-25 NOTE — Telephone Encounter (Signed)
PA APPROVAL RECEIVED LETTER SENT TO [facility]

## 2020-03-26 ENCOUNTER — Ambulatory Visit (HOSPITAL_COMMUNITY)
Admission: RE | Admit: 2020-03-26 | Discharge: 2020-03-26 | Disposition: A | Discharge status: Home / Self Care | Payer: Medicare Other | Source: Ambulatory Visit | Attending: Nurse Practitioner | Admitting: Nurse Practitioner

## 2020-03-26 ENCOUNTER — Other Ambulatory Visit: Payer: Self-pay

## 2020-03-26 ENCOUNTER — Ambulatory Visit (HOSPITAL_COMMUNITY): Payer: Medicare Other

## 2020-03-26 DIAGNOSIS — K7460 Unspecified cirrhosis of liver: Secondary | ICD-10-CM | POA: Diagnosis present

## 2020-03-26 DIAGNOSIS — R188 Other ascites: Secondary | ICD-10-CM | POA: Diagnosis present

## 2020-03-26 DIAGNOSIS — K766 Portal hypertension: Secondary | ICD-10-CM | POA: Diagnosis present

## 2020-03-26 DIAGNOSIS — D696 Thrombocytopenia, unspecified: Secondary | ICD-10-CM | POA: Insufficient documentation

## 2020-03-26 NOTE — Telephone Encounter (Signed)
EG, I spoke with pts son. Tier Reduction has been denied by pts insurance company. Xifax was covered under pts insurance and still $600.00. Pts insurance company states this is the lowest price they can go for the medication. I asked pts son how pt is doing on Lactulose and she isn't taking it. Pt has 3 loose stools daily and that's normal for her per pts son.

## 2020-03-27 ENCOUNTER — Telehealth: Payer: Self-pay | Admitting: Internal Medicine

## 2020-03-27 ENCOUNTER — Encounter: Payer: Self-pay | Admitting: Internal Medicine

## 2020-03-27 NOTE — Telephone Encounter (Signed)
Pt assistance will only cover if the medication isn't covered under the pts insurance plan. I don't mind submitted but keep getting denials months later due to this. I've spoken with the rep as well and she said she would get in contact with the doctors soon.

## 2020-03-27 NOTE — Telephone Encounter (Signed)
New message:    [facility]mer[facility] needs to verify pt's medications and speak on her SOB. Please advise.

## 2020-03-27 NOTE — Telephone Encounter (Signed)
She is no longer on citalopram  She should be on spironolactone daily 50 mg   She should be taking the cipro and torsemide.

## 2020-03-27 NOTE — Telephone Encounter (Signed)
Can we try patient assistance (copay assistance) through the manufacturer? They usually have a pretty good assistance program if I remember right.

## 2020-03-27 NOTE — Telephone Encounter (Signed)
Pt does not currently have Spironolactone or citalopram in her pill box.. is she supposed to be taking them? She also wanted to let you know that citalopram interferes with hydroxyzine and zofran.   Does she need cipro and torsemide which she does currently have in her pill box?  I did explain that she was a new pt and the medication we have down are what we were told she was taking.   I let nurse know that pt follows up with Pulmonary in regards to SOB.

## 2020-03-28 ENCOUNTER — Encounter: Payer: Self-pay | Admitting: Internal Medicine

## 2020-03-28 ENCOUNTER — Telehealth: Payer: Self-pay | Admitting: Pulmonary Disease

## 2020-03-28 NOTE — Telephone Encounter (Signed)
Nurse is aware of response below.

## 2020-03-28 NOTE — Telephone Encounter (Signed)
Ahhh, ok. Well we may be between a rock and a hard place. If the best her insurance can do is $600, not eligible for copay card, and they can't afford that we may have to just stick with lactulose.

## 2020-03-28 NOTE — Telephone Encounter (Signed)
Left message for patient to call back  

## 2020-03-30 ENCOUNTER — Encounter: Payer: Self-pay | Admitting: Nurse Practitioner

## 2020-03-30 NOTE — Assessment & Plan Note (Signed)
Per the patient, she has an approximate 6-year history of known cirrhosis secondary to Karlene Lineman that was previously managed in [place].  She states she was having imaging and visits as well as labs every 6 months.  She did require a paracentesis about 2 years ago in [place].  Overall she was under the impression her liver disease was doing okay and not significantly worse.  However, she appears to have an acute decompensation event when she was admitted to [facility] for dyspnea on 01/30/2020.  Had thoracentesis as well as a paracentesis.  Overt confusion during hospitalization, although her son feels this is possibly due to an antidepressant.  She is currently at baseline with her mental status.  No overt melena.  Severe pruritus during hospitalization and some residual today, but improved.  No other overt GI complaints.  She is scheduled to follow-up with nephrology due to chronic kidney disease, new to the area.  During her hospitalization she did have a TIPS procedure performed.  She is following with interventional radiology at [facility] and her last evaluation noted TIPS patency, next follow-up in 4 months.  She states she had labs a couple days ago.  At this point I will recheck liver labs, right upper quadrant ultrasound for parenchymal status and hepatoma screening.  I have sent a prescription for Atarax to her pharmacy that she can take in the evenings.  Zofran as well for nausea.  Because lactulose fluid was paused for a couple days due to diarrhea, I have sent for Xifaxan 550 mg twice a day to her pharmacy.  Query patient assistance possibility.  Also query need for EGD.  Follow-up in 3 months.

## 2020-03-30 NOTE — Assessment & Plan Note (Signed)
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  She did develop a DVT that was provoked by PICC line placement into her right upper extremity.  This seems to be improving.  Because of her thrombocytopenia and coagulopathy hematology advised against anticoagulation.  Continue to monitor the site, notify of any worsening.  We will recheck labs for status of her thrombocytopenia.  Follow-up in 3 months.  Call for any noted bleeding.

## 2020-03-30 NOTE — Assessment & Plan Note (Signed)
Ashlee Kelly cirrhosis with noted portal hypertension status post TIPS procedure at [facility].  Her TIPS has been evaluated by interventional radiology as an outpatient and found to be patent.  They are planning to see her every 4 months to follow patency of her TIPS.  Noted thrombocytopenia as per below.  Not sure of any previous endoscopy, although none in our system.  However, she did just move to the area recently.  She was previously cared for in [place], [place] and appears she had appropriate care based on her recollection of 60-monthimaging and labs.

## 2020-03-31 ENCOUNTER — Other Ambulatory Visit: Payer: Self-pay

## 2020-03-31 ENCOUNTER — Telehealth: Payer: Self-pay

## 2020-03-31 ENCOUNTER — Telehealth: Payer: Self-pay | Admitting: Pulmonary Disease

## 2020-03-31 DIAGNOSIS — R188 Other ascites: Secondary | ICD-10-CM

## 2020-03-31 DIAGNOSIS — K7460 Unspecified cirrhosis of liver: Secondary | ICD-10-CM

## 2020-03-31 MED ORDER — BLOOD GLUCOSE MONITOR KIT: PACK | 0 refills | Status: DC

## 2020-03-31 MED ORDER — BENZONATATE 100 MG PO CAPS
100.0000 mg | ORAL_CAPSULE | Freq: Two times a day (BID) | ORAL | 0 refills | Status: DC | PRN
Start: 2020-03-31 — End: 2020-04-11

## 2020-03-31 MED ORDER — SPIRONOLACTONE 50 MG PO TABS: 50.0000 mg | ORAL_TABLET | Freq: Every day | ORAL | 1 refills | Status: DC

## 2020-03-31 NOTE — Telephone Encounter (Signed)
New message    Home health calling   1. Need a prescription for a new glucose kit family/ patient can not find the one she has.   2. Need a prescription for persistent cough can Dr. Quay Burow called in something.   3. Patient is asking for something to help her sleep at night     1.Medication Requested:spironolactone (ALDACTONE) 50 MG tablet  2. Pharmacy (Name, Street, [place]):[place], [address]  3. On Med List:   4. Last Visit with PCP:   5. Next visit date with PCP:   Agent: Please be advised that RX refills may take up to 3 business days. We ask that you follow-up with your pharmacy.

## 2020-03-31 NOTE — Telephone Encounter (Signed)
Yes. We can drain the fluid off in office tomorrow if she wants to come at 1:30 pm. Please make appointment  If symptoms worsen before that then she will need to go to ED.

## 2020-03-31 NOTE — Telephone Encounter (Signed)
Son aware of response below.

## 2020-03-31 NOTE — Telephone Encounter (Signed)
I called and spoke with both the patient and her son Laverna Peace and made them aware. They were agreeable to the appointment time for tomorrow and have been scheduled.

## 2020-03-31 NOTE — Telephone Encounter (Signed)
Spoke with pts son. He is aware of pt assistance not being an option for pt and will have pt continue Lactulose.

## 2020-03-31 NOTE — Telephone Encounter (Signed)
Spoke with pt's son Laverna Peace (dpr on file), states that pt is experiencing increased SOB.  Son and pt are worried that her pleural effusion accumulating more fluid, want to know if Dr. Vaughan Browner recommends draining off fluid.  States dyspnea and discomfort have been worse X2 days.    I have copied AVS from 4/23 office visit below.  Dr. Vaughan Browner please advise on how to proceed.  Thanks!   Instructions  You has some reaccumulation of fluid on the right due to cirrhosis We can drain it but it will reaccumulate again I will check with your kidney doctor to see if we can increase your diuretic dose If his breathing worsens then we will need to go to the emergency room for drainage   Follow-up in 1 month with chest x-ray.

## 2020-03-31 NOTE — Telephone Encounter (Signed)
Patient also called office,  Closing this encounter as another one is addressing this issue

## 2020-03-31 NOTE — Telephone Encounter (Signed)
Tessalon perles and spirolactone sent to p[facility].   Need to discuss sleep medication at next visit.  Concern is sleep meds are not good with liver and kidney failure

## 2020-04-01 ENCOUNTER — Encounter: Payer: Self-pay | Admitting: Pulmonary Disease

## 2020-04-01 ENCOUNTER — Other Ambulatory Visit (HOSPITAL_COMMUNITY)
Admission: RE | Admit: 2020-04-01 | Discharge: 2020-04-01 | Disposition: A | Discharge status: Home / Self Care | Payer: Medicare Other | Source: Ambulatory Visit | Attending: Pulmonary Disease | Admitting: Pulmonary Disease

## 2020-04-01 ENCOUNTER — Other Ambulatory Visit: Payer: Self-pay

## 2020-04-01 ENCOUNTER — Ambulatory Visit (INDEPENDENT_AMBULATORY_CARE_PROVIDER_SITE_OTHER): Payer: Medicare Other | Admitting: Pulmonary Disease

## 2020-04-01 ENCOUNTER — Ambulatory Visit (INDEPENDENT_AMBULATORY_CARE_PROVIDER_SITE_OTHER): Payer: Medicare Other

## 2020-04-01 ENCOUNTER — Telehealth: Payer: Self-pay

## 2020-04-01 ENCOUNTER — Encounter: Payer: Self-pay | Admitting: Nurse Practitioner

## 2020-04-01 VITALS — BP 108/60 | HR 100 | Temp 98.0°F | Ht 66.0 in | Wt 161.6 lb

## 2020-04-01 DIAGNOSIS — J90 Pleural effusion, not elsewhere classified: Secondary | ICD-10-CM

## 2020-04-01 LAB — BODY FLUID CELL COUNT WITH DIFFERENTIAL
Eos, Fluid: 0 %
Lymphs, Fluid: 99 %
Monocyte-Macrophage-Serous Fluid: 1 % — ABNORMAL LOW (ref 50–90)
Neutrophil Count, Fluid: 0 % (ref 0–25)
Total Nucleated Cell Count, Fluid: 23 cu mm (ref 0–1000)

## 2020-04-01 LAB — PROTEIN, PLEURAL OR PERITONEAL FLUID: Total protein, fluid: <3 g/dL

## 2020-04-01 LAB — LACTATE DEHYDROGENASE, PLEURAL OR PERITONEAL FLUID: LD, Fluid: 59 U/L — ABNORMAL HIGH (ref 3–23)

## 2020-04-01 LAB — AMYLASE, PLEURAL OR PERITONEAL FLUID: Amylase, Fluid: 24 U/L

## 2020-04-01 MED ORDER — SPIRONOLACTONE 50 MG PO TABS: 75.0000 mg | ORAL_TABLET | Freq: Every day | ORAL | 1 refills | Status: DC

## 2020-04-01 MED ORDER — BLOOD GLUCOSE MONITOR KIT: PACK | 0 refills | Status: DC

## 2020-04-01 MED ORDER — TORSEMIDE 20 MG PO TABS: 30.0000 mg | ORAL_TABLET | Freq: Every day | ORAL | 1 refills | Status: DC

## 2020-04-01 NOTE — Progress Notes (Signed)
Thoracentesis Procedure Note  Pre-operative Diagnosis:  Hepatic hydrothorax   Indications:  Therapeutic thoracentesis   Procedure Details:  Informed consent was obtained after explanation of the risks and benefits of the procedure, refer to the consent documentation.  Time-out Performed immediately prior to the procedure.  All available chest radiographs were reviewed and the patient was subsequently placed in a sitting/semi-recumbent position.  Using ultrasound guidance a large pleural effusion was noted on the right.  The area was prepped with chlorhexadine and draped in sterile fashion.  Following this 1% lidocaine was injected subcutaneously and deep to provide anesthesia.  A small incision was then made parallel and superior to the rib, the thoracentesis needle with catheter was inserted into the chest wall and advanced under constant aspiration.  Upon aspiration of pleural fluid, the catheter was advanced into the pleural space and the needle was removed.  The catheter was then connected to a drainange bag and the fluid was removed under manual drainage. The catheter was then removed during slow forced exhalation and a sterile bandage was placed.  Findings:   2000 mL of pleural fluid was removed.  Fluid was sent for cell count, cytology, Pleural fluid LDH, Pleural Protein    Condition: The patient tolerated the procedure well and remains in the same condition as pre-procedure.  Complications: No complications   Plan: A routine post-procedure xray was ordered.   Tamsen Snider, MD PGY1   Attending note: I was present and supervised the procedure directly.  Marshell Garfinkel MD [facility] Please see [email or web address] for pager details.  04/01/2020, 3:43 PM

## 2020-04-01 NOTE — Progress Notes (Signed)
Belisa Eichholz    [id number]    [birth year]  Primary Care Physician:Burns, Claudina Lick, MD  Referring Physician: Binnie Rail, MD [place],  [place] [address]  Chief complaint: Follow up for recurrent right pleural effusion, hepatic hydrothorax  HPI: 79 year old with chronic kidney disease, Karlene Lineman cirrhosis.  Previously followed at [place] Admitted to [facility] for dyspnea on 01/29/2020 with large right effusion, hepatic hydrothorax.  She underwent multiple thoracentesis and ultimately TIPS procedure performed on 3/15.  Hospitalization also notable for DVT in the right extremity from PICC line.  Hematology consulted and no anticoagulation recommended due to high risk of bleeding from liver disease, coagulopathy and thrombocytopenia.  Has been discharged to rehab.  Notes increasing dyspnea, lower extremity edema recently.  She had a chest x-ray performed at her primary care office on 4/20 which shows moderate right effusion.  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  Currently on Aldactone and torsemide.  Son tells me that in [place] renal function worsened every time diuretics were increased.  Nephrologist- Dr. Dorathy Kinsman MD, [facility] NP, [facility]  Interim History: C/O increasing dyspnea and abdominal tightness, discomfort.  Patient came in today for throracentis on right hepatic hydrothorax, tolerated procedure well.   Outpatient Encounter Medications as of 04/01/2020  Medication Sig  . allopurinol (ZYLOPRIM) 100 MG tablet Take 100 mg by mouth daily.  . benzonatate (TESSALON) 100 MG capsule Take 1 capsule (100 mg total) by mouth 2 (two) times daily as needed for cough.  . blood glucose meter kit and supplies KIT Dispense based on patient and insurance preference. Use up to four times daily as directed. E11.9  . ciprofloxacin (CIPRO) 250 MG tablet Take 250 mg by mouth daily with  breakfast.   . hydrOXYzine (ATARAX/VISTARIL) 10 MG tablet Take 1 tablet (10 mg total) by mouth at bedtime as needed for itching.  . insulin glargine (LANTUS) 100 UNIT/ML injection Inject 30 Units into the skin daily. 30 units at night  . lactulose (CEPHULAC) 10 g packet Take 10 g by mouth daily. Hasn't taken since 03/15/20 d/t loose stool  . levothyroxine (SYNTHROID) 150 MCG tablet Take 150 mcg by mouth daily before breakfast.  . ondansetron (ZOFRAN) 4 MG tablet Take 1 tablet (4 mg total) by mouth every 8 (eight) hours as needed for nausea.  Marland Kitchen PARoxetine (PAXIL) 20 MG tablet Take 1 tablet (20 mg total) by mouth daily.  Marland Kitchen spironolactone (ALDACTONE) 50 MG tablet Take 1 tablet (50 mg total) by mouth daily.  Marland Kitchen torsemide (DEMADEX) 20 MG tablet Take 20 mg by mouth daily.   No facility-administered encounter medications on file as of 04/01/2020.   Physical Exam: Blood pressure 130/80, pulse (!) 57, height 5' 6"  (1.676 m), weight 160 lb (72.6 kg), SpO2 99 %. Gen:      No acute distress HEENT:  EOMI, sclera anicteric Neck:     No masses; no thyromegaly Lungs:    Diminished breath sounds on the right CV:         Regular rate and rhythm; no murmurs Abd:      + bowel sounds; soft, non-tender; no palpable masses, no distension Ext:    4+ edema; adequate peripheral perfusion Skin:      Warm and dry; no rash Neuro: Awake, oriented.  Slow to respond to questions.  Data Reviewed: Imaging: CTA 01/30/2020-large right effusion with complete collapse of the right  lung.  No pulmonary embolism Chest x-ray 03/18/2020-moderate-large right effusion with some aeration of the upper lobe. I have reviewed the images personally.  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 See procedure note.  Her nephrologist was consulted and asked patients labs to be checked in 5 days if going  up on her diuretics. It is not likely patient will tolerate increased diuretic dose given her CKD Stage IV, but with reoccumulation of fluid she needs increase of diuretics. She may be a candidate for Pleurx catheter but would recommend it only if she decides to go to hospice as repeated drainage will cause protein loss, malnutrition. Discussed labs needed to be obtained on Monday of next week at the latest.. Her GI team has standing orders at [facility] for lab work next week, so we will have patient get labs drawn all at once for convenience. Details were discussed with patient and son in office today  Will increase Torsemide to 30 mg and Spirolactone to 75 mg.  Goals of care Prognosis is poor as she is re accumulating fluid in spite of TIPS procedure and diuresis is limited due to kidney function She is not a candidate for liver transplant due to age Reviewed palliation, hospice with patient and son again but they are not ready yet.  No need to continue discussions going forward  Plan/Recommendations: - Increase diuretics, close follow up of kidney function.   Marshell Garfinkel MD [facility] 04/01/2020, 2:57 PM  CC: Binnie Rail, MD

## 2020-04-01 NOTE — Patient Instructions (Addendum)
You tolerated the thoracentesis well today. We will send off the fluid for analysis and contact your nephrologist to discuss going up on your medications. We will get a chest xray which is routine after this procedure.    - Cell count -Cytology - Pleural fluid LDH, Pleural fluid protein  - Chest xray  - Increase Spirolactone to 75 mg daily, and Torsemide to 30 mg daily

## 2020-04-01 NOTE — Telephone Encounter (Signed)
Spoke with pt. States that she is having issues with her breathing lately. Reports increased shortness of breath and wheezing. Pt has an appointment today to come in to have an OV and thoracentesis with Dr. Vaughan Browner. Advised pt that we can address her issues when she comes in for her appointment today.

## 2020-04-01 NOTE — Telephone Encounter (Signed)
See Mychart message below:  This note is in regard to Ivry Pigue' lab test next week. We would like to schedule the labs to be drawn Monday. Her pulmonary dr needs to add kidney functions to the test. Dr Vaughan Browner in [place] is the other physician. Thank you, Lonell Grandchild  [phone].  FYI Spoke with pts son. Pt is having Labs for Dr. Vaughan Browner next Monday and the Lab [facility] is requesting from pt is going to be included in the labs Dr. Vaughan Browner is drawing. Results should result in Epic next week.

## 2020-04-02 ENCOUNTER — Telehealth: Payer: Self-pay | Admitting: Pulmonary Disease

## 2020-04-02 DIAGNOSIS — J90 Pleural effusion, not elsewhere classified: Secondary | ICD-10-CM

## 2020-04-02 NOTE — Telephone Encounter (Signed)
Noted  

## 2020-04-02 NOTE — Telephone Encounter (Signed)
Noted, thanks for the update

## 2020-04-02 NOTE — Telephone Encounter (Signed)
Spoke with Stanton Kidney at [facility] lab, states that orders for pleural fluid were ordered to go to [place] and not be processed by [facility] lab.  New order placed, and Stanton Kidney confirmed that this order was correct and visible to her.  Nothing further needed at this time- will close encounter.

## 2020-04-03 LAB — CYTOLOGY - NON PAP

## 2020-04-05 LAB — TOTAL BILIRUBIN, BODY FLUID: Total bilirubin, fluid: 0.4 mg/dL

## 2020-04-07 ENCOUNTER — Telehealth: Payer: Self-pay | Admitting: Pulmonary Disease

## 2020-04-07 LAB — COMPREHENSIVE METABOLIC PANEL
AG Ratio: 1 (calc) (ref 1.0–2.5)
ALT: 23 U/L (ref 6–29)
AST: 32 U/L (ref 10–35)
Albumin: 2.4 g/dL — ABNORMAL LOW (ref 3.6–5.1)
Alkaline phosphatase (APISO): 106 U/L (ref 37–153)
BUN/Creatinine Ratio: 15 (calc) (ref 6–22)
BUN: 38 mg/dL — ABNORMAL HIGH (ref 7–25)
CO2: 27 mmol/L (ref 20–32)
Calcium: 8.4 mg/dL — ABNORMAL LOW (ref 8.6–10.4)
Chloride: 109 mmol/L (ref 98–110)
Creat: 2.61 mg/dL — ABNORMAL HIGH (ref 0.60–0.93)
Globulin: 2.5 g/dL (calc) (ref 1.9–3.7)
Glucose, Bld: 199 mg/dL — ABNORMAL HIGH (ref 65–139)
Potassium: 4.4 mmol/L (ref 3.5–5.3)
Sodium: 142 mmol/L (ref 135–146)
Total Bilirubin: 3.6 mg/dL — ABNORMAL HIGH (ref 0.2–1.2)
Total Protein: 4.9 g/dL — ABNORMAL LOW (ref 6.1–8.1)

## 2020-04-07 NOTE — Telephone Encounter (Signed)
Called spoke with nurse from home visit on 04/04/20. Nurse states patient was really short of breath could not start or finish a sentence.  Son was asking, what other options are their before having to place a drain. Patient does not want to place drain. As of this visit on 04/04/20 patient did not see an increase in her urine production.  Her oxygen sats are maintaining per Nurse at visit 04/04/20.  Dr. Vaughan Browner performed a thoracentesis in office at her visit on 04/01/20.   Dr. Vaughan Browner please advise on further options.

## 2020-04-07 NOTE — Telephone Encounter (Signed)
Spoke with Shea Stakes who is the nurse going to see patient this afternoon. Explained to her Dr. Matilde Bash recommendations. She will discuss with patient as patient is currently at the GI doctors.   Nothing further needed. If patient wants THORA block 45 min for it please

## 2020-04-07 NOTE — Telephone Encounter (Signed)
Will route to Dr. Vaughan Browner as FYI  Dr. Ernesto Rutherford contact information provided  Dr. Vaughan Browner advise if any further recommendations

## 2020-04-07 NOTE — Telephone Encounter (Signed)
We can offer thoracentesis again today or tomorrow in office. But it is not a viable long term solution. It would give short term relief and the fluid will come back again in a few days.   Make sure she is taking the increased water pills and has labs for today that were supposed to be done at her GI doctors office.

## 2020-04-08 ENCOUNTER — Telehealth: Payer: Self-pay | Admitting: Pulmonary Disease

## 2020-04-08 ENCOUNTER — Other Ambulatory Visit: Payer: Self-pay

## 2020-04-08 DIAGNOSIS — R0602 Shortness of breath: Secondary | ICD-10-CM

## 2020-04-08 MED ORDER — BLOOD GLUCOSE MONITOR KIT: PACK | 0 refills | Status: DC

## 2020-04-08 NOTE — Telephone Encounter (Signed)
Metabolic panel 7/02 reviewed.  Creatinine up to 2.61 from 1.76 Unfortunately as feared her renal function is worsening with increased diuretic.  I called and spoke with patient She is taking 60 mg of torsemide and 50 mg of Aldactone.  I told her to stop the torsemide for 1 day and then resume at her previous dose of 40 mg on 5/13.  Continue Aldactone at her baseline dose of 50 mg. We will get a repeat metabolic panel to be drawn at time of her primary care visit on 5/18  Discussed her symptoms of dyspnea.  She feels that her symptoms are not at a point where she will need a repeat thoracentesis. Discussed comfort and palliation as an option with Pleurx catheter.  She wants to talk about this further with her son.  Marshell Garfinkel MD [facility] Please see [email or web address] for pager details.  04/08/2020, 4:17 PM

## 2020-04-11 ENCOUNTER — Other Ambulatory Visit: Payer: Self-pay | Admitting: Pulmonary Disease

## 2020-04-11 ENCOUNTER — Other Ambulatory Visit: Payer: Self-pay | Admitting: Internal Medicine

## 2020-04-11 NOTE — Addendum Note (Signed)
Addended by: Elton Sin on: 04/11/2020 09:01 AM   Modules accepted: Orders

## 2020-04-11 NOTE — Telephone Encounter (Signed)
BMP order placed for Patient to complete at PCP appointment 04/15/20.

## 2020-04-14 ENCOUNTER — Telehealth: Payer: Self-pay | Admitting: Internal Medicine

## 2020-04-14 NOTE — Telephone Encounter (Signed)
Daughter aware of response.

## 2020-04-14 NOTE — Telephone Encounter (Signed)
OV

## 2020-04-14 NOTE — Telephone Encounter (Signed)
New message:    Pt would like to know if the Dr will prescribe her something for insomnia and if so she would like it sent to [place], [place]. Shea Stakes states the pt is having a hard time sleeping. Please advise.

## 2020-04-14 NOTE — Telephone Encounter (Signed)
She has an appt in two days -how about we discuss then

## 2020-04-15 NOTE — Patient Instructions (Addendum)
  Blood work was ordered.     Medications reviewed and updated.  Changes include :   Increase tessalon perles to three times a day.  Increase hydroxyzine to 20 mg at night for sleep.  Stop the melatonin.     Your prescription(s) have been submitted to your pharmacy. Please take as directed and contact our office if you believe you are having problem(s) with the medication(s).      Please followup in 2 months

## 2020-04-15 NOTE — Progress Notes (Signed)
Subjective:    Patient ID: Ashlee Kelly, female    DOB: 01-Aug-1941, 79 y.o.   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  She is here with her son.    She is still coughing - she is using the tessalon perles.  She takes them BID and they help but do not last that long.    Sleep -she is able to fall as leep but only sleeps for 30 min to one hour.  She is taking 10 mg of melatonin and it does not help.     Upper abd discomfort.  Worse with coughing.  Her abdomen gets distended after eating.   When she wakes up her right eye and cheek is swollen.  She tends to sleep on her right side more.  It started about 10 days ago.  It goes away after she is up for a while.   A little trouble speaking - it feels like her tongue it not working probably.  Right side of mouth tends to droop at times.    The other day - right arm swelling - defined in a band around her arm - it was tender.  It has gone away.  Her arm is still swollen  But better since leaving the hospital.     Has home health nurse coming 2 times per week. She helps monitor her swelling, BP.    Medications and allergies reviewed with patient and updated if appropriate.  Patient Active Problem List   Diagnosis Date Noted  . Portal hypertension ([place]) 03/20/2020  . History of gout 03/18/2020  . Anxiety and depression 03/18/2020  . SOB (shortness of breath) 03/18/2020  . Mitral regurgitation, mild-mod 03/18/2020  . Aortic valve stenosis, mild-mod 03/18/2020  . 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 CKD (chronic kidney disease) stage 4, GFR 15-29 ml/min (HCC) 03/11/2020  . 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 Memory deficit 03/11/2020  . Anemia  03/11/2020  . Acute deep vein thrombosis (DVT) of axillary vein of right upper extremity ([place]) 02/15/2020  . Chronic bilateral low back pain 01/30/2020  . Coagulopathy ([place]) 01/30/2020  . History of tobacco abuse 01/30/2020  . Hypothyroidism 01/30/2020  . IBS (irritable bowel syndrome) 01/30/2020  . Cirrhosis of liver with ascites ([place]) 01/30/2020  . Type 2 diabetes mellitus, without long-term current use of insulin ([place]) 01/30/2020  . 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 Thrombocytopenia ([place]) 01/30/2020    Current Outpatient Medications on File Prior to Visit  Medication Sig Dispense Refill  . allopurinol (ZYLOPRIM) 100 MG tablet Take 100 mg by mouth daily.    . benzonatate (TESSALON) 100 MG capsule TAKE 1 CAPSULE BY MOUTH TWICE DAILY AS NEEDED FOR COUGH 20 capsule 0  . blood glucose meter kit and supplies KIT Dispense based on patient and insurance preference. Use to check blood sugar 3 times daily E11.9 1 each 0  . ciprofloxacin (CIPRO) 250 MG tablet Take 250 mg by mouth daily with breakfast.     . hydrOXYzine (ATARAX/VISTARIL) 10 MG tablet Take 1 tablet (10 mg total) by mouth at bedtime as needed for itching. 30 tablet 0  . insulin glargine (LANTUS) 100 UNIT/ML injection Inject 30 Units into the skin  daily. 30 units at night    . lactulose (CEPHULAC) 10 g packet Take 10 g by mouth daily. Hasn't taken since 03/15/20 d/t loose stool    . levothyroxine (SYNTHROID) 150 MCG tablet Take 150 mcg by mouth daily before breakfast.    . ondansetron (ZOFRAN) 4 MG tablet Take 1 tablet (4 mg total) by mouth every 8 (eight) hours as needed for nausea. 90 tablet 2  . PARoxetine (PAXIL) 20 MG tablet Take 1 tablet (20 mg total) by mouth daily. 30 tablet 5  . spironolactone (ALDACTONE) 50 MG tablet Take 1.5 tablets (75 mg total) by mouth daily. 45 tablet 1  . torsemide (DEMADEX) 20 MG tablet Take 1.5 tablets (30 mg total) by mouth daily. 45 tablet 1   No current  facility-administered medications on file prior to visit.    Past Medical History:  Diagnosis Date  . Arthritis   . Cancer ([place])   . Chronic kidney disease   . Cirrhosis ([place])   . Depression   . Diabetes ([place])   . Heart murmur   . Hepatic encephalopathy ([place])   . Hypothyroidism   . NASH (nonalcoholic steatohepatitis)   . Thrombocytopenia ([place])   . Thyroid disease     Past Surgical History:  Procedure Laterality Date  . APPENDECTOMY    . BACK SURGERY     x3  . CHOLECYSTECTOMY    . FOOT NEUROMA SURGERY Bilateral   . PARACENTESIS    . PARTIAL HYSTERECTOMY    . THORACENTESIS    . TIPS PROCEDURE    . TONSILLECTOMY      Social History   Socioeconomic History  . Marital status: Widowed    Spouse name: Not on file  . Number of children: Not on file  . Years of education: Not on file  . Highest education level: Not on file  Occupational History  . Not on file  Tobacco Use  . Smoking status: Former Smoker    Packs/day: 0.25    Years: 10.00    Pack years: 2.50    Types: Cigarettes    Quit date: 10/2019    Years since quitting: 0.4  . Smokeless tobacco: Never Used  . Tobacco comment: Pt. unsure about number of years smoked. ER  Substance and Sexual Activity  . Alcohol use: Not Currently  . Drug use: Not Currently  . Sexual activity: Not on file  Other Topics Concern  . Not on file  Social History Narrative  . Not on file   Social Determinants of Health   Financial Resource Strain:   . Difficulty of Paying Living Expenses:   Food Insecurity:   . Worried About Charity fundraiser in the Last Year:   . Arboriculturist in the Last Year:   Transportation Needs:   . Film/video editor (Medical):   Marland Kitchen Lack of Transportation (Non-Medical):   Physical Activity:   . Days of Exercise per Week:   . Minutes of Exercise per Session:   Stress:   . Feeling of Stress :   Social Connections:   . Frequency of Communication with Friends and Family:   . Frequency of  Social Gatherings with Friends and Family:   . Attends Religious Services:   . Active Member of Clubs or Organizations:   . Attends Archivist Meetings:   Marland Kitchen Marital Status:     Family History  Problem Relation Age of Onset  . Lung cancer Father  Review of Systems  Constitutional: Negative for fever.  Respiratory: Positive for cough and shortness of breath. Negative for chest tightness and wheezing.   Cardiovascular: Positive for leg swelling. Negative for chest pain and palpitations.  Gastrointestinal: Positive for abdominal distention (after eating), abdominal pain (upper abdomen) and nausea. Negative for blood in stool (no gerd), constipation, diarrhea and vomiting.       Objective:   Vitals:   04/16/20 1412  BP: 110/70  Pulse: (!) 103  Resp: 16  Temp: 98.2 F (36.8 C)  SpO2: 96%   BP Readings from Last 3 Encounters:  04/16/20 110/70  04/16/20 110/70  04/01/20 108/60   Wt Readings from Last 3 Encounters:  04/16/20 155 lb (70.3 kg)  04/16/20 155 lb (70.3 kg)  04/01/20 161 lb 9.6 oz (73.3 kg)   Body mass index is 25.02 kg/m.   Physical Exam    Constitutional: chronic ill appearing. No distress.  HENT:  Head: Normocephalic and atraumatic.  Neck: Neck supple. No tracheal deviation present. No thyromegaly present.  No cervical lymphadenopathy Cardiovascular: Normal rate, regular rhythm and normal heart sounds.   No murmur heard. No carotid bruit .  1 + b/l LE edema.  Edema RUE - pitting Pulmonary/Chest: Effort normal. Decreased breath sounds R base - 1/3 way up. No respiratory distress. No has no wheezes. No rales.  Abdomen: distended, tenderness in upper abdomen w/o rebound or guarding, soft Skin: Skin is warm and dry. Not diaphoretic.  Psychiatric: Normal mood and affect. Behavior is normal.      Assessment & Plan:    See Problem List for Assessment and Plan of chronic medical problems.    This visit occurred during the SARS-CoV-2 public  health emergency.  Safety protocols were in place, including screening questions prior to the visit, additional usage of staff PPE, and extensive cleaning of exam room while observing appropriate contact time as indicated for disinfecting solutions.

## 2020-04-16 ENCOUNTER — Other Ambulatory Visit: Payer: Self-pay

## 2020-04-16 ENCOUNTER — Other Ambulatory Visit: Payer: Self-pay | Admitting: Nurse Practitioner

## 2020-04-16 ENCOUNTER — Encounter: Payer: Self-pay | Admitting: Internal Medicine

## 2020-04-16 ENCOUNTER — Ambulatory Visit (INDEPENDENT_AMBULATORY_CARE_PROVIDER_SITE_OTHER): Payer: Medicare Other | Admitting: Internal Medicine

## 2020-04-16 ENCOUNTER — Ambulatory Visit (INDEPENDENT_AMBULATORY_CARE_PROVIDER_SITE_OTHER): Payer: Medicare Other

## 2020-04-16 VITALS — BP 110/70 | HR 103 | Temp 98.2°F | Resp 16 | Ht 66.0 in | Wt 155.0 lb

## 2020-04-16 DIAGNOSIS — K7460 Unspecified cirrhosis of liver: Secondary | ICD-10-CM | POA: Diagnosis not present

## 2020-04-16 DIAGNOSIS — F419 Anxiety disorder, unspecified: Secondary | ICD-10-CM

## 2020-04-16 DIAGNOSIS — G4700 Insomnia, unspecified: Secondary | ICD-10-CM | POA: Insufficient documentation

## 2020-04-16 DIAGNOSIS — G4709 Other insomnia: Secondary | ICD-10-CM

## 2020-04-16 DIAGNOSIS — N184 Chronic kidney disease, stage 4 (severe): Secondary | ICD-10-CM

## 2020-04-16 DIAGNOSIS — D696 Thrombocytopenia, unspecified: Secondary | ICD-10-CM

## 2020-04-16 DIAGNOSIS — R188 Other ascites: Secondary | ICD-10-CM

## 2020-04-16 DIAGNOSIS — F32A Depression, unspecified: Secondary | ICD-10-CM

## 2020-04-16 DIAGNOSIS — E1122 Type 2 diabetes mellitus with diabetic chronic kidney disease: Secondary | ICD-10-CM | POA: Diagnosis not present

## 2020-04-16 DIAGNOSIS — F329 Major depressive disorder, single episode, unspecified: Secondary | ICD-10-CM

## 2020-04-16 DIAGNOSIS — R059 Cough, unspecified: Secondary | ICD-10-CM

## 2020-04-16 DIAGNOSIS — R05 Cough: Secondary | ICD-10-CM

## 2020-04-16 DIAGNOSIS — Z0000 Encounter for general adult medical examination without abnormal findings: Secondary | ICD-10-CM

## 2020-04-16 DIAGNOSIS — K766 Portal hypertension: Secondary | ICD-10-CM

## 2020-04-16 LAB — COMPREHENSIVE METABOLIC PANEL
ALT: 21 U/L (ref 0–35)
AST: 32 U/L (ref 0–37)
Albumin: 2.5 g/dL — ABNORMAL LOW (ref 3.5–5.2)
Alkaline Phosphatase: 108 U/L (ref 39–117)
BUN: 38 mg/dL — ABNORMAL HIGH (ref 6–23)
CO2: 27 mEq/L (ref 19–32)
Calcium: 8.4 mg/dL (ref 8.4–10.5)
Chloride: 101 mEq/L (ref 96–112)
Creatinine, Ser: 2.44 mg/dL — ABNORMAL HIGH (ref 0.40–1.20)
GFR: 19.19 mL/min — ABNORMAL LOW (ref >60.00)
Glucose, Bld: 150 mg/dL — ABNORMAL HIGH (ref 70–99)
Potassium: 4.1 mEq/L (ref 3.5–5.1)
Sodium: 137 mEq/L (ref 135–145)
Total Bilirubin: 4 mg/dL — ABNORMAL HIGH (ref 0.2–1.2)
Total Protein: 5.2 g/dL — ABNORMAL LOW (ref 6.0–8.3)

## 2020-04-16 MED ORDER — BENZONATATE 100 MG PO CAPS: 100.0000 mg | ORAL_CAPSULE | Freq: Three times a day (TID) | ORAL | 5 refills | Status: DC | PRN

## 2020-04-16 MED ORDER — HYDROXYZINE HCL 10 MG PO TABS: 20.0000 mg | ORAL_TABLET | Freq: Every evening | ORAL | 2 refills | Status: AC | PRN

## 2020-04-16 MED ORDER — SILVER SULFADIAZINE 1 % EX CREA: 1.0000 "application " | TOPICAL_CREAM | Freq: Every day | CUTANEOUS | 0 refills | Status: DC

## 2020-04-16 NOTE — Progress Notes (Signed)
Subjective:   Ashlee Kelly is a 79 y.o. female who presents for Medicare Annual (Subsequent) preventive examination.  Review of Systems:  No ROS. Medicare Wellness Visit. Additional risk factors are reflected in social history. Cardiac Risk Factors include: advanced age (>4mn, >>72women);diabetes mellitus  Sleep Patterns: Issues with insomnia;not getting adequate amount of sleep. Home Safety/Smoke Alarms: Feels safe in home; uses home alarm. Smoke alarms in place. Living environment: 1-story home; Lives with sister; needs for DME; good support system with son. Seat Belt Safety/Bike Helmet: Wears seat belt.    Objective:     Vitals: BP 110/70 (BP Location: Left Arm, Patient Position: Sitting, Cuff Size: Normal)   Pulse (!) 103   Temp 98.2 F (36.8 C)   Resp 16   Ht _0  (1.676 m)   Wt 155 lb (70.3 kg)   SpO2 96%   BMI 25.02 kg/m   Body mass index is 25.02 kg/m.  Advanced Directives 04/16/2020  Does Patient Have a Medical Advance Directive? Yes  Type of [place]  Does patient want to make changes to medical advance directive? No - Patient declined  Copy of [place] Chart? No - copy requested    Tobacco Social History   Tobacco Use  Smoking Status Former Smoker  . Packs/day: 0.25  . Years: 10.00  . Pack years: 2.50  . Types: Cigarettes  . Quit date: 10/2019  . Years since quitting: 0.4  Smokeless Tobacco Never Used  Tobacco Comment   Pt. unsure about number of years smoked. ER     Counseling given: No Comment: Pt. unsure about number of years smoked. ER   Clinical Intake:  Pre-visit preparation completed: Yes  Pain : No/denies pain Pain Score: 0-No pain     BMI - recorded: 25 Nutritional Status: BMI 25 -29 Overweight Nutritional Risks: None Diabetes: No  How often do you need to have someone help you when you read instructions, pamphlets, or other written materials from  your doctor or pharmacy?: 1 - Never What is the last grade level you completed in school?: High School Graduate  Interpreter Needed?: No  Information entered by :: Ashlee Guin N. HLowell Guitar LPN  Past Medical History:  Diagnosis Date  . Arthritis   . Cancer ([place]   . Chronic kidney disease   . Cirrhosis ([place]   . Depression   . Diabetes ([place]   . Heart murmur   . Hepatic encephalopathy ([place]   . Hypothyroidism   . NASH (nonalcoholic steatohepatitis)   . Thrombocytopenia ([place]   . Thyroid disease    Past Surgical History:  Procedure Laterality Date  . APPENDECTOMY    . BACK SURGERY     x3  . CHOLECYSTECTOMY    . FOOT NEUROMA SURGERY Bilateral   . PARACENTESIS    . PARTIAL HYSTERECTOMY    . THORACENTESIS    . TIPS PROCEDURE    . TONSILLECTOMY     Family History  Problem Relation Age of Onset  . Lung cancer Father    Social History   Socioeconomic History  . Marital status: Widowed    Spouse name: Not on file  . Number of children: Not on file  . Years of education: Not on file  . Highest education level: Not on file  Occupational History  . Not on file  Tobacco Use  . Smoking status: Former Smoker    Packs/day: 0.25    Years: 10.00  Pack years: 2.50    Types: Cigarettes    Quit date: 10/2019    Years since quitting: 0.4  . Smokeless tobacco: Never Used  . Tobacco comment: Pt. unsure about number of years smoked. ER  Substance and Sexual Activity  . Alcohol use: Not Currently  . Drug use: Not Currently  . Sexual activity: Not on file  Other Topics Concern  . Not on file  Social History Narrative  . Not on file   Social Determinants of Health   Financial Resource Strain:   . Difficulty of Paying Living Expenses:   Food Insecurity:   . Worried About Charity fundraiser in the Last Year:   . Arboriculturist in the Last Year:   Transportation Needs:   . Film/video editor (Medical):   Marland Kitchen Lack of Transportation (Non-Medical):   Physical Activity:     . Days of Exercise per Week:   . Minutes of Exercise per Session:   Stress:   . Feeling of Stress :   Social Connections:   . Frequency of Communication with Friends and Family:   . Frequency of Social Gatherings with Friends and Family:   . Attends Religious Services:   . Active Member of Clubs or Organizations:   . Attends Archivist Meetings:   Marland Kitchen Marital Status:     Outpatient Encounter Medications as of 04/16/2020  Medication Sig  . allopurinol (ZYLOPRIM) 100 MG tablet Take 100 mg by mouth daily.  . benzonatate (TESSALON) 100 MG capsule TAKE 1 CAPSULE BY MOUTH TWICE DAILY AS NEEDED FOR COUGH  . blood glucose meter kit and supplies KIT Dispense based on patient and insurance preference. Use to check blood sugar 3 times daily E11.9  . ciprofloxacin (CIPRO) 250 MG tablet Take 250 mg by mouth daily with breakfast.   . hydrOXYzine (ATARAX/VISTARIL) 10 MG tablet Take 1 tablet (10 mg total) by mouth at bedtime as needed for itching.  . insulin glargine (LANTUS) 100 UNIT/ML injection Inject 30 Units into the skin daily. 30 units at night  . lactulose (CEPHULAC) 10 g packet Take 10 g by mouth daily. Hasn't taken since 03/15/20 d/t loose stool  . levothyroxine (SYNTHROID) 150 MCG tablet Take 150 mcg by mouth daily before breakfast.  . ondansetron (ZOFRAN) 4 MG tablet Take 1 tablet (4 mg total) by mouth every 8 (eight) hours as needed for nausea.  Marland Kitchen PARoxetine (PAXIL) 20 MG tablet Take 1 tablet (20 mg total) by mouth daily.  Marland Kitchen spironolactone (ALDACTONE) 50 MG tablet Take 1.5 tablets (75 mg total) by mouth daily.  Marland Kitchen torsemide (DEMADEX) 20 MG tablet Take 1.5 tablets (30 mg total) by mouth daily.   No facility-administered encounter medications on file as of 04/16/2020.    Activities of Daily Living In your present state of health, do you have any difficulty performing the following activities: 04/16/2020  Hearing? N  Vision? N  Difficulty concentrating or making decisions? Y   Walking or climbing stairs? Y  Dressing or bathing? N  Doing errands, shopping? Y  Preparing Food and eating ? Y  Using the Toilet? N  Comment wears a pad  In the past six months, have you accidently leaked urine? Y  Do you have problems with loss of bowel control? N  Managing your Medications? Y  Comment son manages her medication bu doing prefilled 7-day container  Managing your Finances? Y  Housekeeping or managing your Housekeeping? Y    Patient Care Team:  Binnie Rail, MD as PCP - General (Internal Medicine)    Assessment:   This is a routine wellness examination for Ashlee Kelly.  Exercise Activities and Dietary recommendations Current Exercise Habits: Home exercise routine(does t.v exercises), Type of exercise: Other - see comments(does tv exercises), Time (Minutes): 30, Frequency (Times/Week): 5, Weekly Exercise (Minutes/Week): 150, Intensity: Mild, Exercise limited by: respiratory conditions(s);orthopedic condition(s)  Goals   None     Fall Risk Fall Risk  04/16/2020 03/18/2020  Falls in the past year? 0 0  Number falls in past yr: 0 0  Injury with Fall? 0 0  Risk for fall due to : History of fall(s) -  Follow up Falls evaluation completed;Falls prevention discussed Falls evaluation completed   Is the patient's home free of loose throw rugs in walkways, pet beds, electrical cords, etc?   yes      Grab bars in the bathroom? yes      Handrails on the stairs?   yes      Adequate lighting?   yes  Depression Screen PHQ 2/9 Scores 04/16/2020  PHQ - 2 Score 0           Immunization History  Administered Date(s) Administered  . Influenza, High Dose Seasonal PF 09/21/2019  . Moderna SARS-COVID-2 Vaccination 01/25/2020    Qualifies for Shingles Vaccine? Completed; stated she received from [facility]  Topic Date Due  . HEMOGLOBIN A1C  Never done  . FOOT EXAM  Never done  . TETANUS/TDAP  Never done  . DEXA SCAN  Never done   . PNA vac Low Risk Adult (1 of 2 - PCV13) Never done  . OPHTHALMOLOGY EXAM  03/01/2019  . COVID-19 Vaccine (2 - Moderna 2-dose series) 02/22/2020  . INFLUENZA VACCINE  06/29/2020    Cancer Screenings: Lung: Low Dose CT Chest recommended if Age 57-80 years, 30 pack-year currently smoking OR have quit w/in 15years. Patient does qualify. Breast:  Up to date on Mammogram? No   Up to date of Bone Density/Dexa? No Colorectal: not recommended due to age     Plan:     Reviewed health maintenance screenings with patient today and relevant education, vaccines, and/or referrals were provided.    Continue doing brain stimulating activities (puzzles, reading, adult coloring books, staying active) to keep memory sharp.    Continue to eat heart healthy diet (full of fruits, vegetables, whole grains, lean protein, water--limit salt, fat, and sugar intake) and increase physical activity as tolerated.  I have personally reviewed and noted the following in the patient's chart:   . Medical and social history . Use of alcohol, tobacco or illicit drugs  . Current medications and supplements . Functional ability and status . Nutritional status . Physical activity . Advanced directives . List of other physicians . Hospitalizations, surgeries, and ER visits in previous 12 months . Vitals . Screenings to include cognitive, depression, and falls . Referrals and appointments  In addition, I have reviewed and discussed with patient certain preventive protocols, quality metrics, and best practice recommendations. A written personalized care plan for preventive services as well as general preventive health recommendations were provided to patient.     Sheral Flow, LPN  03/25/622  Nurse Health Advisor

## 2020-04-16 NOTE — Assessment & Plan Note (Signed)
Chronic likely related to pleural effusion - her SOB is stable Symptomatic treatment of cough Tessalon perles - increase to TID

## 2020-04-16 NOTE — Assessment & Plan Note (Signed)
Chronic Diet controlled a1c

## 2020-04-16 NOTE — Assessment & Plan Note (Signed)
Acute Not sleep well Taking hydroxyzine 10 mg at night for itching - this is probably the safest medication for her to take Increase hydroxyzine to 20 mg nightly for insomnia and itching

## 2020-04-16 NOTE — Assessment & Plan Note (Signed)
Chronic Worsened recently after increase in diuretics - doses were decreased Recheck CMP today

## 2020-04-16 NOTE — Assessment & Plan Note (Signed)
Chronic Continue paxil 20 mg daily

## 2020-04-17 ENCOUNTER — Telehealth: Payer: Self-pay

## 2020-04-17 ENCOUNTER — Encounter: Payer: Self-pay | Admitting: Internal Medicine

## 2020-04-17 LAB — HEMOGLOBIN A1C: Hgb A1c MFr Bld: 5.6 % (ref 4.6–6.5)

## 2020-04-17 NOTE — Telephone Encounter (Signed)
New message    Needs verbal order for nursing & speech therapy.

## 2020-04-18 NOTE — Telephone Encounter (Signed)
Pt aware of results 

## 2020-04-18 NOTE — Telephone Encounter (Signed)
Gave ok for orders.

## 2020-04-21 LAB — AMYLASE, PLEURAL OR PERITONEAL FLUID: Amylase, Fluid: 24 U/L

## 2020-04-23 ENCOUNTER — Ambulatory Visit: Payer: Medicare Other | Admitting: Pulmonary Disease

## 2020-04-29 ENCOUNTER — Encounter: Payer: Self-pay | Admitting: Internal Medicine

## 2020-05-15 ENCOUNTER — Telehealth: Payer: Self-pay

## 2020-05-15 NOTE — Telephone Encounter (Signed)
New message   LPN Shea Stakes calling from [facility] home health wanted to report a fall on Saturday, May 10, 2020  The patient has two skin tears on the right arm   The patient did not go to ED denial any other injury does have bruises no complaints other injuries at this time  Vital signs 97.9, 110 - this normal for the patient 18, 122/66.

## 2020-05-17 NOTE — Telephone Encounter (Signed)
noted 

## 2020-05-19 ENCOUNTER — Telehealth: Payer: Self-pay | Admitting: Pulmonary Disease

## 2020-05-19 NOTE — Telephone Encounter (Signed)
Called and spoke with pt's son Laverna Peace letting him know that we have spoken with Dr. Vaughan Browner and he is going to do thoracentesis in office tomorrow 6/22 at 8:30. Stated to him to arrive at office at 8:15 for check-in to be ready for the thoracentesis at 8:30 and he verbalized understanding. Nothing further needed.

## 2020-05-19 NOTE — Telephone Encounter (Signed)
Dr. Vaughan Browner, please see mychart message sent by pt's son Jeneen Rinks and advise on it. We do not have pt on schedule showing that she is supposed to be coming in for a thoracentesis.

## 2020-05-19 NOTE — Telephone Encounter (Signed)
Message sent to me by Dr. Vaughan Browner about trying to get pt in office for thoracentesis: Mannam, Hart Robinsons, MD  You 16 minutes ago (10:19 AM)   I can come in to do the thoracentesis procedure if support staff is available at 8:30 am tomorrow.    Message text     Checked with TD and she spoke with Lattie Haw about Dr. Vaughan Browner needing to do in-office thoracentesis tomorrow at 8:30. Lattie Haw is going to help out with this. Patient's son has been made aware that this is going to take place tomorrow and that they need to arrive at 8:15 for check-in to be ready for the thoracentesis at 8:30.  Patrice, please open Dr. Matilde Bash schedule at 8:30 and add patient on there for in-office thoracentesis. Thank you!  Routing this back to Dr. Vaughan Browner as an Juluis Rainier that this has been taken care of.

## 2020-05-19 NOTE — Telephone Encounter (Signed)
Open Dr. Matilde Bash schedule and placed patient in the 8:30am slot -pr

## 2020-05-19 NOTE — Telephone Encounter (Signed)
We have not scheduled any visit for her. Looks like her dyspnea is worse and she needs an urgent  thoracentesis for right hepatic hydrothorax.  I am rounding at  [facility] if we have the staff available to help with procedure.today afternoon 3-4 pm or tomorrow early morning at 8:30 am  Please check with Lauren about staff availability.  Otherwise patient may have to go to ED if her dyspnea is worsening.  Marshell Garfinkel MD  Pulmonary and Critical Care 05/19/2020, 9:39 AM

## 2020-05-19 NOTE — Telephone Encounter (Signed)
ATC pt's son, he answered the line but could not hear me. Will try back.

## 2020-05-19 NOTE — Telephone Encounter (Signed)
Lauren is not in the office today. Providers that usually do in-office thoracenteses are not working in the office today or tomorrow.

## 2020-05-20 ENCOUNTER — Ambulatory Visit (INDEPENDENT_AMBULATORY_CARE_PROVIDER_SITE_OTHER): Payer: Medicare Other

## 2020-05-20 ENCOUNTER — Ambulatory Visit (INDEPENDENT_AMBULATORY_CARE_PROVIDER_SITE_OTHER): Payer: Medicare Other | Admitting: Pulmonary Disease

## 2020-05-20 ENCOUNTER — Telehealth: Payer: Self-pay | Admitting: Pulmonary Disease

## 2020-05-20 ENCOUNTER — Other Ambulatory Visit: Payer: Self-pay

## 2020-05-20 ENCOUNTER — Encounter: Payer: Self-pay | Admitting: Pulmonary Disease

## 2020-05-20 DIAGNOSIS — J90 Pleural effusion, not elsewhere classified: Secondary | ICD-10-CM

## 2020-05-20 MED ORDER — AMOXICILLIN-POT CLAVULANATE 875-125 MG PO TABS
1.0000 | ORAL_TABLET | Freq: Two times a day (BID) | ORAL | 0 refills | Status: DC
Start: 2020-05-20 — End: 2020-05-26

## 2020-05-20 NOTE — Progress Notes (Addendum)
Ashlee Kelly    [id number]    12-31-1940  Primary Care Physician:Burns, Claudina Lick, MD  Referring Physician: Binnie Rail, MD [place],  [place] [address]  Chief complaint: Follow up for recurrent right pleural effusion, hepatic hydrothorax  HPI: 79 year old with chronic kidney disease, Ashlee Kelly cirrhosis.  Previously followed at [place] Admitted to [facility] for dyspnea on 01/29/2020 with large right effusion, hepatic hydrothorax.  She underwent multiple thoracentesis and ultimately TIPS procedure performed on 3/15.  Hospitalization also notable for DVT in the right extremity from PICC line.  Hematology consulted and no anticoagulation recommended due to high risk of bleeding from liver disease, coagulopathy and thrombocytopenia.  Has been discharged to rehab.  Notes increasing dyspnea, lower extremity edema recently.  She had a chest x-ray performed at her primary care office on 4/20 which shows moderate right effusion.  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  Currently on Aldactone and torsemide.  Son tells me that in [place] renal function worsened every time diuretics were increased.  Nephrologist- Dr. Dorathy Kinsman MD, [facility] [place] [facility] NP, [facility]  Interim History: We had tried to increase diuretic dose at last visit but unfortunately creatinine increased and she is back at her baseline dose Here as an urgent visit for worsening dyspnea.  Need for repeat thoracentesis  Outpatient Encounter Medications as of 05/20/2020  Medication Sig  . allopurinol (ZYLOPRIM) 100 MG tablet Take 100 mg by mouth daily.  . benzonatate (TESSALON) 100 MG capsule Take 1 capsule (100 mg total) by mouth 3 (three) times daily as needed for cough.  . blood glucose meter kit and supplies KIT Dispense based on patient and insurance preference. Use to check blood sugar 3 times daily E11.9  . ciprofloxacin  (CIPRO) 250 MG tablet Take 250 mg by mouth daily with breakfast.   . hydrOXYzine (ATARAX/VISTARIL) 10 MG tablet Take 2 tablets (20 mg total) by mouth at bedtime as needed for itching (insomnia).  . insulin glargine (LANTUS) 100 UNIT/ML injection Inject 30 Units into the skin daily. 30 units at night  . lactulose (CEPHULAC) 10 g packet Take 10 g by mouth daily. Hasn't taken since 03/15/20 d/t loose stool  . levothyroxine (SYNTHROID) 150 MCG tablet Take 150 mcg by mouth daily before breakfast.  . ondansetron (ZOFRAN) 4 MG tablet Take 1 tablet (4 mg total) by mouth every 8 (eight) hours as needed for nausea.  Marland Kitchen PARoxetine (PAXIL) 20 MG tablet Take 1 tablet (20 mg total) by mouth daily.  . silver sulfADIAZINE (SILVADENE) 1 % cream Apply 1 application topically daily.  Marland Kitchen torsemide (DEMADEX) 20 MG tablet Take 1.5 tablets (30 mg total) by mouth daily. (Patient taking differently: Take 20 mg by mouth daily. )  . [DISCONTINUED] spironolactone (ALDACTONE) 50 MG tablet Take 1.5 tablets (75 mg total) by mouth daily.   No facility-administered encounter medications on file as of 05/20/2020.   Physical Exam: Blood pressure (!) 118/52, pulse (!) 120, height 5' 6"  (1.676 m), weight 154 lb (69.9 kg), SpO2 91 %. Gen:      No acute distress HEENT:  EOMI, sclera anicteric Neck:     No masses; no thyromegaly Lungs:    Diminished breath sounds on the right CV:         Regular rate and rhythm; no murmurs Abd:      + bowel sounds; soft, non-tender; no palpable masses, no distension Ext:  No edema; adequate peripheral perfusion Skin:      Warm and dry; no rash Neuro: alert and oriented x 3 Psych: normal mood and affect  Data Reviewed: Imaging: CTA 01/30/2020-large right effusion with complete collapse of the right lung.  No pulmonary embolism Chest x-ray 03/18/2020-moderate-large right effusion with some aeration of the upper lobe. I have reviewed the images personally.  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    Pt complained of dizziness and chest pressure post thoracentesis. Vitals are stable. Post CXR with tiny rt apical pneumothorax and residual moderate effusion She was observed for 1 hr with repeat CXR showing slight increase but vitals are stable on room air. She likely has pneumothorax ex vacuo and trapped lung  CXR also shows B/L infiltrates suggestive of PNA. Will give Augmentin for 7 days, she is already on cipro There is a possibility of re expansion pulmonary edema but will avoid increase in diuretics due to CKD. She has not tolerated increase in dose of diuretics in past.  Follow up CXR tomorrow. She knows to go to ED if worse  Goals of care Prognosis is poor as she is re accumulating fluid in spite of TIPS procedure and diuresis is limited due to kidney function She is not a candidate for liver transplant due to age Patient still does not want to consider hospice.  She is considering enrolling in palliative care  Plan/Recommendations: -Monitor effusion post thoracentesis. Repeat CXR tomorrow  Follow-up in 1 to 2 months.  Marshell Garfinkel MD [place] [facility] 05/20/2020, 8:27 AM  CC: Binnie Rail, MD

## 2020-05-20 NOTE — Progress Notes (Signed)
Patient ID: Ashlee Kelly, female   DOB: [birth year], 79 y.o.   MRN: [id number]   Thoracentesis Procedure Note  Pre-operative Diagnosis: Right pleural effusion  Post-operative Diagnosis: same  Indications: Hepatic hydrothorax  Procedure Details  Consent: Informed consent was obtained. Risks of the procedure were discussed including: infection, bleeding, pain, pneumothorax.  Under sterile conditions the patient was positioned. Betadine solution and sterile drapes were utilized.  1% plain lidocaine was used to anesthetize the psoterior rib space. Fluid was obtained without any difficulties and minimal blood loss.  A dressing was applied to the wound and wound care instructions were provided.   Findings 2000 ml of straw colored pleural fluid was obtained.   Condition: stable.  Plan F/U 1 -2 months in office  Marshell Garfinkel MD [place] [facility] Please see [email or web address] for pager details.  05/20/2020, 10:05 AM

## 2020-05-20 NOTE — Progress Notes (Signed)
Patient VS- 0908 98/56 BP 103 HR 97% 2 L  VS-0917 BP 112/58 HR 104 Sats 97% 2L [place]  VS 0927 116/56 BP 103 HR 99% 2L [place]  Patient complained of pain when inhaling, sob, and cough.  Dr Vaughan Browner made aware. Stat cxr and continued monitoring.  0953- VS BP 114/58 HR 101 Sats 97% 2 L [place]  Patient sitting up in chair.  Patient stated sob has improved some, decrease cough.   1020- VS BP 118/58 HR 99 Sats 93% RA   Patient sitting upright in wheelchair. No signs of distress.  Patient stated she did feel sob,and some pain with inhaling.  1030-Call received from Radiologist- tiny right pneumothorax showing on cxr. Dr Vaughan Browner notified.   Monitor Patient 1-2 hours, repeat cxr, place on 4 L [place].   If cxr shows no growth, may d/c home.  1043 VS- BP- 118/58 HR- 104 Sats- 98% 4 L [place]  1115 VS-  116/56 BP 94 HR 97% 4 L [place]  Patient resting quietly.  No signs of distress. Patient stated she is feeling better at this time.  Patient son and sister at bedside.  1145-VS 120/60 BP 92 HR 96% 4L [place]- sats  Patient taken to stat cxr via wheelchair, then returned to exam room.  No signs of distress.  1220-Patient sitting in chair still complaining with some cough.   BP- 116/56 HR- 96 sats- 98% 4 L [place]  Reduced O2 to 2L [place].  1238- sats 98% 2 L [place] Reduced O2 to RA   Received call from Dr [id number] Per Dr Vaughan Browner- CXR shows tiny pneumothorax.  Patient ok to go home, but needs to return 05/02/2020 for repeat cxr,and start Augmentin BID x's 7 days. Patient needs to go to ED if symptoms get worse.  Orders placed.  19- Dr Vaughan Browner instructions given to Patient, son ,and sister.  Patient is  aware of cxr hours and results will be called to her later in the day. Patient stated she is feeling better at this time.  She is still having some cough, but no signs of distress. VS- 118/62 BP HR- 91 Sats- 92-93% RA  Patient leaving office with son and sister. Nothing further at this time.

## 2020-05-20 NOTE — Telephone Encounter (Signed)
Called and spoke with Patient.  Patient stated she is feeling better then earlier today.  SOB and cough improved some.   Patient stated she did still have some mild back pain, and some mild pain when inhaling, but stated it had improved from earlier today. Patient is coming to office tomorrow morning for repeat cxr.

## 2020-05-20 NOTE — Telephone Encounter (Signed)
Patient received thoracentesis in office today by Dr Vaughan Browner. OV time was 0830. Patient monitored after procedure.  Patient VS- 0908 98/56 BP 103 HR 97% 2 L  VS-0917 BP 112/58 HR 104 Sats 97% 2L [place]  VS 0927 116/56 BP 103 HR 99% 2L [place]  Patient complained of pain when inhaling, sob, and cough.  Dr Vaughan Browner made aware. Stat cxr and continued monitoring.  0953- VS BP 114/58 HR 101 Sats 97% 2 L [place]  Patient sitting up in chair.  Patient stated sob has improved some, decrease cough.   1020- VS BP 118/58 HR 99 Sats 93% RA   Patient sitting upright in wheelchair. No signs of distress.  Patient stated she did feel sob,and some pain with inhaling.  1030-Call received from Radiologist- tiny right pneumothorax showing on cxr. Dr Vaughan Browner notified.   Monitor Patient 1-2 hours, repeat cxr, place on 4 L [place].   If cxr shows no growth, may d/c home.  1043 VS- BP- 118/58 HR- 104 Sats- 98% 4 L [place]  1115 VS-  116/56 BP 94 HR 97% 4 L [place]  Patient resting quietly.  No signs of distress. Patient stated she is feeling better at this time.  Patient son and sister at bedside.  1145-VS 120/60 BP 92 HR 96% 4L [place]- sats  Patient taken to stat cxr via wheelchair, then returned to exam room.  No signs of distress.  1220-Patient sitting in chair still complaining with some cough.   BP- 116/56 HR- 96 sats- 98% 4 L [place]  Reduced O2 to 2L [place].  1238- sats 98% 2 L [place] Reduced O2 to RA   Received call from Dr [id number] Per Dr Vaughan Browner- CXR shows tiny pneumothorax.  Patient ok to go home, but needs to return 05/17/2020 for repeat cxr,and start Augmentin BID x's 7 days. Patient needs to go to ED if symptoms get worse.  Orders placed.  22- Dr Vaughan Browner instructions given to Patient, son ,and sister.  Patient is  aware of cxr hours and results will be called to her later in the day. Patient stated she is feeling better at this time.  She is still having some cough, but no signs of distress. VS-  118/62 BP HR- 91 Sats- 92-93% RA  Patient leaving office with son and sister. Nothing further at this time.

## 2020-05-20 NOTE — Patient Instructions (Signed)
We will get a chest x-ray today to check on your lungs after the thoracentesis Follow-up in 1 to 2 months.

## 2020-05-21 ENCOUNTER — Other Ambulatory Visit: Payer: Self-pay

## 2020-05-21 ENCOUNTER — Ambulatory Visit: Payer: Medicare Other

## 2020-05-21 ENCOUNTER — Ambulatory Visit (INDEPENDENT_AMBULATORY_CARE_PROVIDER_SITE_OTHER): Payer: Medicare Other

## 2020-05-21 ENCOUNTER — Inpatient Hospital Stay (HOSPITAL_COMMUNITY)
Admission: EM | Admit: 2020-05-21 | Discharge: 2020-05-29 | DRG: 199 | Disposition: E | Payer: Medicare Other | Attending: Internal Medicine | Admitting: Internal Medicine

## 2020-05-21 ENCOUNTER — Emergency Department (HOSPITAL_COMMUNITY): Payer: Medicare Other

## 2020-05-21 ENCOUNTER — Encounter (HOSPITAL_COMMUNITY): Payer: Self-pay | Admitting: Emergency Medicine

## 2020-05-21 DIAGNOSIS — R54 Age-related physical debility: Secondary | ICD-10-CM | POA: Diagnosis present

## 2020-05-21 DIAGNOSIS — D631 Anemia in chronic kidney disease: Secondary | ICD-10-CM | POA: Diagnosis present

## 2020-05-21 DIAGNOSIS — N184 Chronic kidney disease, stage 4 (severe): Secondary | ICD-10-CM | POA: Diagnosis present

## 2020-05-21 DIAGNOSIS — F329 Major depressive disorder, single episode, unspecified: Secondary | ICD-10-CM | POA: Diagnosis present

## 2020-05-21 DIAGNOSIS — K7460 Unspecified cirrhosis of liver: Secondary | ICD-10-CM | POA: Diagnosis present

## 2020-05-21 DIAGNOSIS — F419 Anxiety disorder, unspecified: Secondary | ICD-10-CM | POA: Diagnosis present

## 2020-05-21 DIAGNOSIS — Z9689 Presence of other specified functional implants: Secondary | ICD-10-CM

## 2020-05-21 DIAGNOSIS — D696 Thrombocytopenia, unspecified: Secondary | ICD-10-CM | POA: Diagnosis present

## 2020-05-21 DIAGNOSIS — R188 Other ascites: Secondary | ICD-10-CM | POA: Diagnosis present

## 2020-05-21 DIAGNOSIS — K7290 Hepatic failure, unspecified without coma: Secondary | ICD-10-CM | POA: Diagnosis not present

## 2020-05-21 DIAGNOSIS — E119 Type 2 diabetes mellitus without complications: Secondary | ICD-10-CM

## 2020-05-21 DIAGNOSIS — E1122 Type 2 diabetes mellitus with diabetic chronic kidney disease: Secondary | ICD-10-CM | POA: Diagnosis present

## 2020-05-21 DIAGNOSIS — J918 Pleural effusion in other conditions classified elsewhere: Secondary | ICD-10-CM | POA: Diagnosis present

## 2020-05-21 DIAGNOSIS — R0603 Acute respiratory distress: Secondary | ICD-10-CM | POA: Diagnosis not present

## 2020-05-21 DIAGNOSIS — I350 Nonrheumatic aortic (valve) stenosis: Secondary | ICD-10-CM | POA: Diagnosis present

## 2020-05-21 DIAGNOSIS — Z515 Encounter for palliative care: Secondary | ICD-10-CM | POA: Diagnosis not present

## 2020-05-21 DIAGNOSIS — I82A21 Chronic embolism and thrombosis of right axillary vein: Secondary | ICD-10-CM | POA: Diagnosis present

## 2020-05-21 DIAGNOSIS — R64 Cachexia: Secondary | ICD-10-CM | POA: Diagnosis present

## 2020-05-21 DIAGNOSIS — K767 Hepatorenal syndrome: Secondary | ICD-10-CM | POA: Diagnosis present

## 2020-05-21 DIAGNOSIS — K589 Irritable bowel syndrome without diarrhea: Secondary | ICD-10-CM | POA: Diagnosis present

## 2020-05-21 DIAGNOSIS — E039 Hypothyroidism, unspecified: Secondary | ICD-10-CM | POA: Diagnosis present

## 2020-05-21 DIAGNOSIS — K766 Portal hypertension: Secondary | ICD-10-CM | POA: Diagnosis present

## 2020-05-21 DIAGNOSIS — K7291 Hepatic failure, unspecified with coma: Secondary | ICD-10-CM | POA: Diagnosis not present

## 2020-05-21 DIAGNOSIS — Z66 Do not resuscitate: Secondary | ICD-10-CM | POA: Diagnosis present

## 2020-05-21 DIAGNOSIS — Z20822 Contact with and (suspected) exposure to covid-19: Secondary | ICD-10-CM | POA: Diagnosis present

## 2020-05-21 DIAGNOSIS — J90 Pleural effusion, not elsewhere classified: Secondary | ICD-10-CM

## 2020-05-21 DIAGNOSIS — Z79899 Other long term (current) drug therapy: Secondary | ICD-10-CM

## 2020-05-21 DIAGNOSIS — J189 Pneumonia, unspecified organism: Secondary | ICD-10-CM | POA: Diagnosis present

## 2020-05-21 DIAGNOSIS — J9601 Acute respiratory failure with hypoxia: Secondary | ICD-10-CM | POA: Diagnosis present

## 2020-05-21 DIAGNOSIS — E872 Acidosis: Secondary | ICD-10-CM | POA: Diagnosis not present

## 2020-05-21 DIAGNOSIS — Z9049 Acquired absence of other specified parts of digestive tract: Secondary | ICD-10-CM

## 2020-05-21 DIAGNOSIS — Z888 Allergy status to other drugs, medicaments and biological substances status: Secondary | ICD-10-CM

## 2020-05-21 DIAGNOSIS — Z79890 Hormone replacement therapy (postmenopausal): Secondary | ICD-10-CM

## 2020-05-21 DIAGNOSIS — M109 Gout, unspecified: Secondary | ICD-10-CM | POA: Diagnosis present

## 2020-05-21 DIAGNOSIS — N179 Acute kidney failure, unspecified: Secondary | ICD-10-CM | POA: Diagnosis present

## 2020-05-21 DIAGNOSIS — Z87891 Personal history of nicotine dependence: Secondary | ICD-10-CM

## 2020-05-21 DIAGNOSIS — Z794 Long term (current) use of insulin: Secondary | ICD-10-CM

## 2020-05-21 DIAGNOSIS — Z6823 Body mass index (BMI) 23.0-23.9, adult: Secondary | ICD-10-CM

## 2020-05-21 DIAGNOSIS — J939 Pneumothorax, unspecified: Secondary | ICD-10-CM | POA: Diagnosis present

## 2020-05-21 DIAGNOSIS — G4700 Insomnia, unspecified: Secondary | ICD-10-CM | POA: Diagnosis present

## 2020-05-21 DIAGNOSIS — J95811 Postprocedural pneumothorax: Secondary | ICD-10-CM | POA: Diagnosis present

## 2020-05-21 DIAGNOSIS — M545 Low back pain: Secondary | ICD-10-CM | POA: Diagnosis present

## 2020-05-21 DIAGNOSIS — K7581 Nonalcoholic steatohepatitis (NASH): Secondary | ICD-10-CM | POA: Diagnosis present

## 2020-05-21 DIAGNOSIS — Z90711 Acquired absence of uterus with remaining cervical stump: Secondary | ICD-10-CM

## 2020-05-21 DIAGNOSIS — Z7189 Other specified counseling: Secondary | ICD-10-CM

## 2020-05-21 LAB — PROCALCITONIN: Procalcitonin: 1.24 ng/mL

## 2020-05-21 LAB — LACTIC ACID, PLASMA
Lactic Acid, Venous: 4.4 mmol/L (ref 0.5–1.9)
Lactic Acid, Venous: 2.1 mmol/L (ref 0.5–1.9)

## 2020-05-21 LAB — TSH: TSH: 0.463 u[IU]/mL (ref 0.350–4.500)

## 2020-05-21 LAB — COMPREHENSIVE METABOLIC PANEL
ALT: 16 U/L (ref 0–44)
AST: 39 U/L (ref 15–41)
Albumin: 1.9 g/dL — ABNORMAL LOW (ref 3.5–5.0)
Alkaline Phosphatase: 76 U/L (ref 38–126)
Anion gap: 13 (ref 5–15)
BUN: 67 mg/dL — ABNORMAL HIGH (ref 8–23)
CO2: 18 mmol/L — ABNORMAL LOW (ref 22–32)
Calcium: 8.3 mg/dL — ABNORMAL LOW (ref 8.9–10.3)
Chloride: 108 mmol/L (ref 98–111)
Creatinine, Ser: 3.72 mg/dL — ABNORMAL HIGH (ref 0.44–1.00)
GFR calc Af Amer: 13 mL/min — ABNORMAL LOW (ref >60)
GFR calc non Af Amer: 11 mL/min — ABNORMAL LOW (ref >60)
Glucose, Bld: 92 mg/dL (ref 70–99)
Potassium: 3.9 mmol/L (ref 3.5–5.1)
Sodium: 139 mmol/L (ref 135–145)
Total Bilirubin: 3.4 mg/dL — ABNORMAL HIGH (ref 0.3–1.2)
Total Protein: 5.1 g/dL — ABNORMAL LOW (ref 6.5–8.1)

## 2020-05-21 LAB — CBG MONITORING, ED: Glucose-Capillary: 91 mg/dL (ref 70–99)

## 2020-05-21 LAB — CBC
HCT: 33.5 % — ABNORMAL LOW (ref 36.0–46.0)
Hemoglobin: 10.8 g/dL — ABNORMAL LOW (ref 12.0–15.0)
MCH: 31.9 pg (ref 26.0–34.0)
MCHC: 32.2 g/dL (ref 30.0–36.0)
MCV: 98.8 fL (ref 80.0–100.0)
Platelets: 129 10*3/uL — ABNORMAL LOW (ref 150–400)
RBC: 3.39 MIL/uL — ABNORMAL LOW (ref 3.87–5.11)
RDW: 18.8 % — ABNORMAL HIGH (ref 11.5–15.5)
WBC: 17.8 10*3/uL — ABNORMAL HIGH (ref 4.0–10.5)
nRBC: 0 % (ref 0.0–0.2)

## 2020-05-21 LAB — PHOSPHORUS: Phosphorus: 4.6 mg/dL (ref 2.5–4.6)

## 2020-05-21 LAB — SARS CORONAVIRUS 2 BY RT PCR (HOSPITAL ORDER, PERFORMED IN ~~LOC~~ HOSPITAL LAB): SARS Coronavirus 2: NEGATIVE

## 2020-05-21 LAB — PROTIME-INR
INR: 1.9 — ABNORMAL HIGH (ref 0.8–1.2)
Prothrombin Time: 21.1 seconds — ABNORMAL HIGH (ref 11.4–15.2)

## 2020-05-21 LAB — APTT: aPTT: 35 seconds (ref 24–36)

## 2020-05-21 LAB — MAGNESIUM: Magnesium: 1.8 mg/dL (ref 1.7–2.4)

## 2020-05-21 MED ORDER — ALLOPURINOL 100 MG PO TABS: 100.0000 mg | ORAL_TABLET | Freq: Every day | ORAL | Status: DC

## 2020-05-21 MED ORDER — SODIUM CHLORIDE 0.9% FLUSH
10.0000 mL | Freq: Three times a day (TID) | INTRAVENOUS | Status: DC
  Administered 2020-05-21 – 2020-05-26 (×5): 10 mL

## 2020-05-21 MED ORDER — INSULIN GLARGINE 100 UNIT/ML ~~LOC~~ SOLN
30.0000 [IU] | Freq: Every day | SUBCUTANEOUS | Status: DC
  Administered 2020-05-21 – 2020-05-23 (×3): 30 [IU] via SUBCUTANEOUS
  Filled 2020-05-21 (×4): qty 0.3

## 2020-05-21 MED ORDER — SODIUM CHLORIDE 0.9 % IV SOLN
500.0000 mg | INTRAVENOUS | Status: DC
  Administered 2020-05-21 – 2020-05-23 (×3): 500 mg via INTRAVENOUS
  Filled 2020-05-21 (×4): qty 500

## 2020-05-21 MED ORDER — PAROXETINE HCL 20 MG PO TABS
20.0000 mg | ORAL_TABLET | Freq: Every day | ORAL | Status: DC
  Administered 2020-05-21 – 2020-05-24 (×4): 20 mg via ORAL
  Filled 2020-05-21 (×5): qty 1

## 2020-05-21 MED ORDER — TORSEMIDE 20 MG PO TABS
30.0000 mg | ORAL_TABLET | Freq: Every day | ORAL | Status: DC
  Administered 2020-05-21 – 2020-05-22 (×2): 30 mg via ORAL
  Administered 2020-05-23: 20 mg via ORAL
  Filled 2020-05-21 (×3): qty 2

## 2020-05-21 MED ORDER — SODIUM CHLORIDE 0.9 % IV SOLN
1.0000 g | INTRAVENOUS | Status: DC
  Administered 2020-05-21 – 2020-05-23 (×3): 1 g via INTRAVENOUS
  Filled 2020-05-21 (×3): qty 10

## 2020-05-21 MED ORDER — INSULIN ASPART 100 UNIT/ML ~~LOC~~ SOLN
0.0000 [IU] | Freq: Three times a day (TID) | SUBCUTANEOUS | Status: DC
  Administered 2020-05-23: 3 [IU] via SUBCUTANEOUS
  Administered 2020-05-23: 2 [IU] via SUBCUTANEOUS
  Administered 2020-05-23: 3 [IU] via SUBCUTANEOUS
  Administered 2020-05-24: 2 [IU] via SUBCUTANEOUS

## 2020-05-21 MED ORDER — TORSEMIDE 20 MG PO TABS: 30.0000 mg | ORAL_TABLET | Freq: Every day | ORAL | Status: DC

## 2020-05-21 MED ORDER — LEVOTHYROXINE SODIUM 75 MCG PO TABS
150.0000 ug | ORAL_TABLET | Freq: Every day | ORAL | Status: DC
  Administered 2020-05-22 – 2020-05-24 (×3): 150 ug via ORAL
  Filled 2020-05-21 (×3): qty 2

## 2020-05-21 MED ORDER — AMOXICILLIN-POT CLAVULANATE 500-125 MG PO TABS
1.0000 | ORAL_TABLET | Freq: Two times a day (BID) | ORAL | Status: DC
  Filled 2020-05-21: qty 1

## 2020-05-21 MED ORDER — INSULIN ASPART 100 UNIT/ML ~~LOC~~ SOLN
0.0000 [IU] | Freq: Every day | SUBCUTANEOUS | Status: DC
  Administered 2020-05-22: 3 [IU] via SUBCUTANEOUS

## 2020-05-21 MED ORDER — ACETAMINOPHEN 650 MG RE SUPP: 650.0000 mg | Freq: Four times a day (QID) | RECTAL | Status: DC | PRN

## 2020-05-21 MED ORDER — HYDROMORPHONE HCL 1 MG/ML IJ SOLN
0.5000 mg | INTRAMUSCULAR | Status: DC | PRN
  Administered 2020-05-21 – 2020-05-22 (×3): 1 mg via INTRAVENOUS
  Filled 2020-05-21 (×3): qty 1

## 2020-05-21 MED ORDER — HYDROXYZINE HCL 10 MG PO TABS
20.0000 mg | ORAL_TABLET | Freq: Every evening | ORAL | Status: DC | PRN
  Administered 2020-05-23 – 2020-05-24 (×2): 20 mg via ORAL
  Filled 2020-05-21 (×3): qty 2

## 2020-05-21 MED ORDER — ACETAMINOPHEN 325 MG PO TABS
650.0000 mg | ORAL_TABLET | Freq: Four times a day (QID) | ORAL | Status: DC | PRN
  Administered 2020-05-23: 650 mg via ORAL

## 2020-05-21 MED ORDER — OXYCODONE HCL 5 MG PO TABS
5.0000 mg | ORAL_TABLET | ORAL | Status: DC | PRN
  Administered 2020-05-22 – 2020-05-24 (×3): 5 mg via ORAL
  Filled 2020-05-21 (×4): qty 1

## 2020-05-21 MED ORDER — ONDANSETRON HCL 4 MG/2ML IJ SOLN: 4.0000 mg | Freq: Four times a day (QID) | INTRAMUSCULAR | Status: DC | PRN

## 2020-05-21 MED ORDER — ONDANSETRON HCL 4 MG PO TABS: 4.0000 mg | ORAL_TABLET | Freq: Four times a day (QID) | ORAL | Status: DC | PRN

## 2020-05-21 MED ORDER — AMOXICILLIN-POT CLAVULANATE 875-125 MG PO TABS: 1.0000 | ORAL_TABLET | Freq: Two times a day (BID) | ORAL | Status: DC

## 2020-05-21 MED ORDER — SODIUM CHLORIDE 0.9% FLUSH
10.0000 mL | Freq: Three times a day (TID) | INTRAVENOUS | Status: DC
  Administered 2020-05-21 – 2020-05-24 (×6): 10 mL

## 2020-05-21 NOTE — H&P (Addendum)
History and Physical    Ashlee Kelly CVE:[id number] DOB: [birth year] DOA: 05/05/2020  PCP: Binnie Rail, MD  Patient coming from: Home  I have personally briefly reviewed patient's old medical records in [place]  Chief Complaint: Shortness of breath  HPI: Ashlee Kelly is a 79 y.o. female with medical history significant of hypothyroidism, Karlene Lineman cirrhosis, recurrent right hepatic hydrothorax, chronic thrombocytopenia, CKD, diabetes, arthritis, presented to emergency department with shortness of breath.  Patient tells me that she had outpatient thoracentesis with her primary pulmonologist yesterday -2 L of straw-colored fluid was removed. Her chest x-ray shows bilateral infiltrates therefore Augmentin was prescribed. She tolerated procedure very well however last night she developed shortness of breath which got worse this morning .She had a follow up chest x-ray  which shows mildly increased size of small right apical pneumothorax and increased moderate right pleural effusion.    Patient tells me that she has worsening shortness of breath associated with midsternal chest pain, dry cough and leg swelling however denies association with fever, chills, orthopnea, PND, nausea, vomiting, palpitation, sleep or bowel changes.  She reports mild headache and decreased urinary output due to decreased p.o. intake.  However denies dysuria, hematuria, foul-smelling urine.  She lives with her sister at home.  No history of smoking, alcohol, illicit drug use.  She tells me that she was admitted at [facility] in March and had recurrent thoracentesis and ultimately tips procedure which was performed on 3/15.  He was also found to have DVT in the right extremity from PICC line however no anticoagulation was recommended due to high risk of bleeding from liver disease.  She tells me that she underwent thoracentesis twice after the discharge from the hospital.  ED Course: Upon arrival to ED: Patient  tachycardic, tachypneic, hypoxic and oxygen saturation in 70s-placed on 4 L of oxygen via nasal cannula, chest x-ray shows a pico pneumothorax therefore pigtail chest tube inserted in ED.  Patient afebrile with leukocytosis and 17,000, platelet: 129, CMP shows worsening kidney function.  EDP consulted PCCM.  [facility] hospitalist consulted for admission for pneumothorax.  Review of Systems: As per HPI otherwise negative.    Past Medical History:  Diagnosis Date  . Arthritis   . Cancer ([place])   . Chronic kidney disease   . Cirrhosis ([place])   . Depression   . Diabetes ([place])   . Heart murmur   . Hepatic encephalopathy ([place])   . Hypothyroidism   . NASH (nonalcoholic steatohepatitis)   . Thrombocytopenia ([place])   . Thyroid disease     Past Surgical History:  Procedure Laterality Date  . APPENDECTOMY    . BACK SURGERY     x3  . CHOLECYSTECTOMY    . FOOT NEUROMA SURGERY Bilateral   . PARACENTESIS    . PARTIAL HYSTERECTOMY    . THORACENTESIS    . TIPS PROCEDURE    . TONSILLECTOMY       reports that she quit smoking about 6 months ago. Her smoking use included cigarettes. She has a 2.50 pack-year smoking history. She has never used smokeless tobacco. She reports previous alcohol use. She reports previous drug use.  Allergies  Allergen Reactions  . Celebrex [Celecoxib]     Family History  Problem Relation Age of Onset  . Lung cancer Father     Prior to Admission medications   Medication Sig Start Date End Date Taking? Authorizing Provider  allopurinol (ZYLOPRIM) 100 MG tablet Take 100 mg by mouth  daily.   Yes [provider]  amoxicillin-clavulanate (AUGMENTIN) 875-125 MG tablet Take 1 tablet by mouth 2 (two) times daily. 05/20/20  Yes Mannam, Praveen, MD  benzonatate (TESSALON) 100 MG capsule Take 1 capsule (100 mg total) by mouth 3 (three) times daily as needed for cough. 04/16/20  Yes Burns, Claudina Lick, MD  hydrOXYzine (ATARAX/VISTARIL) 10 MG tablet Take 2 tablets (20 mg  total) by mouth at bedtime as needed for itching (insomnia). 04/16/20  Yes Burns, Claudina Lick, MD  LANTUS SOLOSTAR 100 UNIT/ML Solostar Pen Inject 30 Units into the skin at bedtime.  04/01/20  Yes [provider]  levothyroxine (SYNTHROID) 150 MCG tablet Take 150 mcg by mouth daily before breakfast.   Yes [provider]  Misc Natural Products (OSTEO BI-FLEX/5-LOXIN ADVANCED) TABS Take 1 tablet by mouth daily.   Yes [provider]  Multiple Vitamin (MULTIVITAMIN ADULT PO) Take 1 tablet by mouth every evening.   Yes [provider]  ondansetron (ZOFRAN) 4 MG tablet Take 1 tablet (4 mg total) by mouth every 8 (eight) hours as needed for nausea. 03/20/20  Yes Carlis Stable, NP  PARoxetine (PAXIL) 20 MG tablet Take 1 tablet (20 mg total) by mouth daily. 03/19/20  Yes Burns, Claudina Lick, MD  torsemide (DEMADEX) 20 MG tablet Take 1.5 tablets (30 mg total) by mouth daily. Patient taking differently: Take 20 mg by mouth every evening.  04/01/20  Yes Mannam, Praveen, MD  blood glucose meter kit and supplies KIT Dispense based on patient and insurance preference. Use to check blood sugar 3 times daily E11.9 04/08/20   Binnie Rail, MD  silver sulfADIAZINE (SILVADENE) 1 % cream Apply 1 application topically daily. Patient not taking: Reported on 05/09/2020 04/16/20   Binnie Rail, MD    Physical Exam: Vitals:   05/01/2020 1450 05/18/2020 1451  BP: (!) 117/49   Pulse: (!) 113   Resp: (!) 24   Temp: 97.7 F (36.5 C)   TempSrc: Oral   SpO2: (!) 77% 90%    Constitutional: NAD, calm, comfortable, on 4 L of oxygen via nasal cannula, obese, communicating well Eyes: PERRL, lids and conjunctivae normal ENMT: Mucous membranes are dry. Posterior pharynx clear of any exudate or lesions.Normal dentition.  Neck: normal, supple, no masses, no thyromegaly Respiratory: Decreased breath sounds noted on right side of lungs Cardiovascular: Tachycardic, systolic murmur noted / rubs / gallops.   Bilateral 2+ pitting edema positive.  See tomorrow 2+ pedal pulses. No carotid bruits.  Abdomen: no tenderness, no masses palpated. No hepatosplenomegaly. Bowel sounds positive.  Musculoskeletal: no clubbing / cyanosis. No joint deformity upper and lower extremities. Good ROM, no contractures. Normal muscle tone.  Skin: no rashes, lesions, ulcers. No induration Neurologic: CN 2-12 grossly intact. Sensation intact, DTR normal. Strength 5/5 in all 4.  Psychiatric: Normal judgment and insight. Alert and oriented x 3. Normal mood.    Labs on Admission: I have personally reviewed following labs and imaging studies  CBC: Recent Labs  Lab 05/24/2020 1500  WBC 17.8*  HGB 10.8*  HCT 33.5*  MCV 98.8  PLT 448*   Basic Metabolic Panel: Recent Labs  Lab 05/06/2020 1500  NA 139  K 3.9  CL 108  CO2 18*  GLUCOSE 92  BUN 67*  CREATININE 3.72*  CALCIUM 8.3*   GFR: Estimated Creatinine Clearance: 11.9 mL/min (A) (by C-G formula based on SCr of 3.72 mg/dL (H)). Liver Function Tests: Recent Labs  Lab 05/25/2020 1500  AST  39  ALT 16  ALKPHOS 76  BILITOT 3.4*  PROT 5.1*  ALBUMIN 1.9*   No results for input(s): LIPASE, AMYLASE in the last 168 hours. No results for input(s): AMMONIA in the last 168 hours. Coagulation Profile: No results for input(s): INR, PROTIME in the last 168 hours. Cardiac Enzymes: No results for input(s): CKTOTAL, CKMB, CKMBINDEX, TROPONINI in the last 168 hours. BNP (last 3 results) No results for input(s): PROBNP in the last 8760 hours. HbA1C: No results for input(s): HGBA1C in the last 72 hours. CBG: No results for input(s): GLUCAP in the last 168 hours. Lipid Profile: No results for input(s): CHOL, HDL, LDLCALC, TRIG, CHOLHDL, LDLDIRECT in the last 72 hours. Thyroid Function Tests: No results for input(s): TSH, T4TOTAL, FREET4, T3FREE, THYROIDAB in the last 72 hours. Anemia Panel: No results for input(s): VITAMINB12, FOLATE, FERRITIN, TIBC, IRON, RETICCTPCT  in the last 72 hours. Urine analysis: No results found for: COLORURINE, APPEARANCEUR, LABSPEC, [place], GLUCOSEU, HGBUR, BILIRUBINUR, KETONESUR, PROTEINUR, UROBILINOGEN, NITRITE, LEUKOCYTESUR  Radiological Exams on Admission: DG Chest 2 View  Result Date: 05/18/2020 CLINICAL DATA:  Follow-up pneumothorax.  Shortness of breath. EXAM: CHEST - 2 VIEW COMPARISON:  05/20/2020 FINDINGS: The cardiomediastinal silhouette is unchanged. A small right apical pneumothorax has mildly enlarged. A moderate-sized right pleural effusion has also increased. Patchy airspace opacities persist throughout both lungs and have mildly worsened on the right and are unchanged on the left. No acute osseous abnormality is seen. IMPRESSION: 1. Mildly increased size of small right apical pneumothorax. 2. Increased moderate right pleural effusion. 3. Bilateral airspace disease with mild progression on the right. Electronically Signed   By: Logan Bores M.D.   On: 05/24/2020 14:34   DG Chest 2 View  Result Date: 05/20/2020 CLINICAL DATA:  Post thoracentesis, pleural effusion EXAM: CHEST - 2 VIEW COMPARISON:  04/01/2020 FINDINGS: Normal heart size, mediastinal contours, and pulmonary vascularity. Atherosclerotic calcification aorta. Patchy airspace infiltrates bilaterally increased from previous exam question multifocal pneumonia. Decreased RIGHT pleural effusion and basilar atelectasis. Tiny RIGHT pneumothorax following thoracentesis. No acute osseous findings. Prior cervical spine surgery and mild thoracolumbar scoliosis noted. IMPRESSION: Tiny RIGHT pneumothorax post thoracentesis. Decreased RIGHT pleural effusion and basilar atelectasis. Patchy airspace infiltrates bilaterally question multifocal pneumonia. Critical Value/emergent results were called by telephone at the time of interpretation on 05/20/2020 at 1022 hrs to [place] at [facility], who verbally acknowledged these results. Electronically Signed   By:  Lavonia Dana M.D.   On: 05/20/2020 10:23   DG Chest 1V REPEAT Same Day  Result Date: 05/20/2020 CLINICAL DATA:  Status post right thoracentesis EXAM: CHEST - 1 VIEW SAME DAY COMPARISON:  Film from earlier in the same day. FINDINGS: Cardiac shadow is stable. Patchy airspace opacities are noted and increased particularly on the right following thoracentesis. Slight increase in right-sided pleural effusion is noted. The small apical pneumothorax has increased slightly in the interval from the prior exam. No bony abnormality is noted. IMPRESSION: Slight increase in right-sided pneumothorax. Slight increase in right-sided effusion. Increase in airspace opacity within the right lung which may represent some re-expansion edema These results will be called to the ordering clinician or representative by the Radiologist Assistant, and communication documented in the PACS or [employer]. Electronically Signed   By: Inez Catalina M.D.   On: 05/20/2020 12:24   DG Chest Port 1 View  Result Date: 05/18/2020 CLINICAL DATA:  Follow-up pneumothorax EXAM: PORTABLE CHEST 1 VIEW COMPARISON:  05/20/2020, 05/20/2020 FINDINGS: Interval insertion of  right-sided chest drainage catheter, the pigtail is at the periphery of the right mid lung. There is decreased right pleural effusion with small residual. Right apical pneumothorax is also decreased without definitive pleural line on this exam. Worsening airspace disease in the left lower lung. Stable cardiomediastinal silhouette. IMPRESSION: 1. Interval insertion of right-sided chest drainage catheter with pigtail at the periphery of the right mid lung, there is interval decrease in right pleural effusion. Decreased right apical pneumothorax without definitive pleural line on the current exam. 2. Worsening airspace disease in the left lower lung. Electronically Signed   By: Donavan Foil M.D.   On: 05/25/2020 17:03    Assessment/Plan Principal Problem:   Pneumothorax Active  Problems:   Hypothyroidism   Diabetes (HCC)   Pleural effusion on right, recurrent due to hepatic hydrothorax   Thrombocytopenia (HCC)   NASH (nonalcoholic steatohepatitis)   Acute hypoxemic respiratory failure (HCC)   Acute on chronic kidney failure (HCC)    Acute hypoxemic respiratory failure in the setting of right apical pneumothorax 2/2 thoracentesis: -S/p pigtail chest tube placement in ED. -Patient is requiring 4 L of oxygen via nasal cannula.  Reviewed chest x-ray. -She is afebrile with leukocytosis of 17,000.  -Admit patient to stepdown unit for close monitoring.  On continuous pulse ox.  We will try to wean off of oxygen as tolerated. -Appreciate PCCM help. -Chest x-ray shows worsening airspace disease in left lower lobe.  Will start patient on Rocephin and azithromycin.  Will get procalcitonin level, blood culture level, lactic acid level.  Recurrent right-sided pleural effusion secondary to hepatic hydrothorax: -Patient has underlying Karlene Lineman cirrhosis s/p TIPS on 3/15. -S/p multiple thoracentesis. Failed increased management with PO diuretics -PCCM recommended Pleurx placement -Continue torsemide -Patient has poor prognosis-we will consult palliative care  Hypothyroidism: Check TSH -Continue levothyroxine  Acute on chronic kidney failure: -Baseline creatinine is 2.44. Today creatinine is 3.72 with a GFR of 11 -Likely secondary to hepatorenal. -Strict INO's and daily weight.  Continue home  dose of torsemide. -Monitor kidney function closely.  Thrombocytopenia: Platelet: 129 -No active bleeding. -Monitor levels closely.  Depression/anxiety: Continue hydroxyzine/Paxil  Diabetes mellitus: Continue Lantus 30 units at bedtime and started patient on sliding scale insulin.  Gout: Hold allopurinol due to worsening kidney function.  Elevated total bilirubin: 3.4 -Chronic.  Liver enzymes: WNL  DVT prophylaxis: SCD  Code Status: DNR-confirmed with the patient Family  Communication: Patient's sister present at bedside.  Plan of care discussed with patient and her sister at bedside in length and they verbalized understanding and agreed with it. Disposition Plan: To be determined Consults called: PCCM by EDP & palliative care Admission status: Inpatient   Mckinley Jewel MD [facility]  If 7PM-7AM, please contact night-coverage [email or web address] Password [facility]  05/20/2020, 5:19 PM

## 2020-05-21 NOTE — ED Provider Notes (Signed)
[facility] EMERGENCY DEPARTMENT Provider Note   CSN: [id number] Arrival date & time: 05/06/2020  1443     History Chief Complaint  Patient presents with  . Shortness of Breath    Ashlee Kelly is a 79 y.o. female.  HPI     79 year old female with history of Karlene Lineman liver cirrhosis, diabetes, thrombocytopenia, hepatic hydropneumothorax comes in a chief complaint of shortness of breath.  Patient had thoracentesis done yesterday for her recurrent pleural effusion.  She reports that the x-ray thereafter had shown some concerns, therefore she had received a call today from pulmonary clinic for repeat x-ray.  Repeat x-ray was looking worse therefore she was advised to come to the ER.  Patient states that her breathing had improved but now she is having pain with inspiration and pain in her back and chest.  The pain is similar to what she has with large pleural effusion.  Past Medical History:  Diagnosis Date  . Arthritis   . Cancer ([place])   . Chronic kidney disease   . Cirrhosis ([place])   . Depression   . Diabetes ([place])   . Heart murmur   . Hepatic encephalopathy ([place])   . Hypothyroidism   . NASH (nonalcoholic steatohepatitis)   . Thrombocytopenia ([place])   . Thyroid disease     Patient Active Problem List   Diagnosis Date Noted  . NASH (nonalcoholic steatohepatitis)   . Acute hypoxemic respiratory failure ([place])   . Acute on chronic kidney failure ([place])   . Pneumothorax   . Insomnia 04/16/2020  . Cough 04/16/2020  . Portal hypertension ([place]) 03/20/2020  . History of gout 03/18/2020  . Anxiety and depression 03/18/2020  . SOB (shortness of breath) 03/18/2020  . Mitral regurgitation, mild-mod 03/18/2020  . Aortic valve stenosis, mild-mod 03/18/2020  . 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 CKD (chronic kidney disease) stage 4, GFR 15-29 ml/min (HCC) 03/11/2020  . 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 Memory  deficit 03/11/2020  . Anemia 03/11/2020  . Acute deep vein thrombosis (DVT) of axillary vein of right upper extremity ([place]) 02/15/2020  . Chronic bilateral low back pain 01/30/2020  . Coagulopathy ([place]) 01/30/2020  . History of tobacco abuse 01/30/2020  . Hypothyroidism 01/30/2020  . IBS (irritable bowel syndrome) 01/30/2020  . Cirrhosis of liver with ascites ([place]) 01/30/2020  . Diabetes ([place]) 01/30/2020  . 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 Thrombocytopenia ([place]) 01/30/2020    Past Surgical History:  Procedure Laterality Date  . APPENDECTOMY    . BACK SURGERY     x3  . CHOLECYSTECTOMY    . FOOT NEUROMA SURGERY Bilateral   . PARACENTESIS    . PARTIAL HYSTERECTOMY    . THORACENTESIS    . TIPS PROCEDURE    . TONSILLECTOMY       OB History   No obstetric history on file.     Family History  Problem Relation Age of Onset  . Lung cancer Father     Social History   Tobacco Use  . Smoking status: Former Smoker    Packs/day: 0.25    Years: 10.00    Pack years: 2.50    Types: Cigarettes    Quit date: 10/2019    Years since quitting: 0.5  . Smokeless tobacco: Never Used  . Tobacco comment: Pt. unsure about number of years smoked. ER  Substance Use Topics  . Alcohol use: Not  Currently  . Drug use: Not Currently    Home Medications Prior to Admission medications   Medication Sig Start Date End Date Taking? Authorizing Provider  allopurinol (ZYLOPRIM) 100 MG tablet Take 100 mg by mouth daily.   Yes [provider]  amoxicillin-clavulanate (AUGMENTIN) 875-125 MG tablet Take 1 tablet by mouth 2 (two) times daily. 05/20/20  Yes Mannam, Praveen, MD  benzonatate (TESSALON) 100 MG capsule Take 1 capsule (100 mg total) by mouth 3 (three) times daily as needed for cough. 04/16/20  Yes Burns, Claudina Lick, MD  hydrOXYzine (ATARAX/VISTARIL) 10 MG tablet Take 2 tablets (20 mg total) by mouth at bedtime as needed for itching (insomnia).  04/16/20  Yes Burns, Claudina Lick, MD  LANTUS SOLOSTAR 100 UNIT/ML Solostar Pen Inject 30 Units into the skin at bedtime.  04/01/20  Yes [provider]  levothyroxine (SYNTHROID) 150 MCG tablet Take 150 mcg by mouth daily before breakfast.   Yes [provider]  Misc Natural Products (OSTEO BI-FLEX/5-LOXIN ADVANCED) TABS Take 1 tablet by mouth daily.   Yes [provider]  Multiple Vitamin (MULTIVITAMIN ADULT PO) Take 1 tablet by mouth every evening.   Yes [provider]  ondansetron (ZOFRAN) 4 MG tablet Take 1 tablet (4 mg total) by mouth every 8 (eight) hours as needed for nausea. 03/20/20  Yes Carlis Stable, NP  PARoxetine (PAXIL) 20 MG tablet Take 1 tablet (20 mg total) by mouth daily. 03/19/20  Yes Burns, Claudina Lick, MD  torsemide (DEMADEX) 20 MG tablet Take 1.5 tablets (30 mg total) by mouth daily. Patient taking differently: Take 20 mg by mouth every evening.  04/01/20  Yes Mannam, Praveen, MD  blood glucose meter kit and supplies KIT Dispense based on patient and insurance preference. Use to check blood sugar 3 times daily E11.9 04/08/20   Binnie Rail, MD  silver sulfADIAZINE (SILVADENE) 1 % cream Apply 1 application topically daily. Patient not taking: Reported on 05/28/2020 04/16/20   Binnie Rail, MD    Allergies    Celebrex [celecoxib]  Review of Systems   Review of Systems  Constitutional: Positive for activity change.  Respiratory: Positive for shortness of breath.   Cardiovascular: Positive for chest pain.  Musculoskeletal: Positive for back pain.  All other systems reviewed and are negative.   Physical Exam Updated Vital Signs BP (!) 95/46   Pulse 97   Temp 97.7 F (36.5 C) (Oral)   Resp 18   SpO2 98%   Physical Exam Vitals and nursing note reviewed.  Constitutional:      Appearance: She is well-developed.  HENT:     Head: Normocephalic and atraumatic.  Cardiovascular:     Rate and Rhythm: Normal rate.  Pulmonary:     Effort:  Pulmonary effort is normal. No tachypnea.     Breath sounds: Examination of the right-upper field reveals decreased breath sounds. Examination of the right-middle field reveals decreased breath sounds. Examination of the right-lower field reveals decreased breath sounds. Decreased breath sounds present.  Abdominal:     General: Bowel sounds are normal.  Musculoskeletal:     Cervical back: Normal range of motion and neck supple.  Skin:    General: Skin is warm and dry.  Neurological:     Mental Status: She is alert and oriented to person, place, and time.     ED Results / Procedures / Treatments   Labs (all labs ordered are listed, but only abnormal results are displayed) Labs Reviewed  CBC - Abnormal; Notable for the following components:      Result Value   WBC 17.8 (*)    RBC 3.39 (*)    Hemoglobin 10.8 (*)    HCT 33.5 (*)    RDW 18.8 (*)    Platelets 129 (*)    All other components within normal limits  COMPREHENSIVE METABOLIC PANEL - Abnormal; Notable for the following components:   CO2 18 (*)    BUN 67 (*)    Creatinine, Ser 3.72 (*)    Calcium 8.3 (*)    Total Protein 5.1 (*)    Albumin 1.9 (*)    Total Bilirubin 3.4 (*)    GFR calc non Af Amer 11 (*)    GFR calc Af Amer 13 (*)    All other components within normal limits  PROTIME-INR - Abnormal; Notable for the following components:   Prothrombin Time 21.1 (*)    INR 1.9 (*)    All other components within normal limits  LACTIC ACID, PLASMA - Abnormal; Notable for the following components:   Lactic Acid, Venous 4.4 (*)    All other components within normal limits  SARS CORONAVIRUS 2 BY RT PCR (HOSPITAL ORDER, [facility])  CULTURE, BLOOD (ROUTINE X 2)  CULTURE, BLOOD (ROUTINE X 2)  MAGNESIUM  PHOSPHORUS  TSH  APTT  PROCALCITONIN  COMPREHENSIVE METABOLIC PANEL  CBC  LACTIC ACID, PLASMA  CBG MONITORING, ED    EKG EKG Interpretation  Date/Time:  Wednesday May 21 2020 14:50:42  EDT Ventricular Rate:  112 PR Interval:    QRS Duration: 62 QT Interval:  242 QTC Calculation: 330 R Axis:   3 Text Interpretation: Undetermined rhythm Low voltage QRS Cannot rule out Anteroseptal infarct , age undetermined ST & T wave abnormality, consider inferior ischemia Abnormal ECG indetermined rhythm Confirmed by Varney Biles [phone]) on 05/20/2020 4:48:23 PM   Radiology DG Chest 2 View  Result Date: 05/22/2020 CLINICAL DATA:  Follow-up pneumothorax.  Shortness of breath. EXAM: CHEST - 2 VIEW COMPARISON:  05/20/2020 FINDINGS: The cardiomediastinal silhouette is unchanged. A small right apical pneumothorax has mildly enlarged. A moderate-sized right pleural effusion has also increased. Patchy airspace opacities persist throughout both lungs and have mildly worsened on the right and are unchanged on the left. No acute osseous abnormality is seen. IMPRESSION: 1. Mildly increased size of small right apical pneumothorax. 2. Increased moderate right pleural effusion. 3. Bilateral airspace disease with mild progression on the right. Electronically Signed   By: Logan Bores M.D.   On: 05/18/2020 14:34   DG Chest 2 View  Result Date: 05/20/2020 CLINICAL DATA:  Post thoracentesis, pleural effusion EXAM: CHEST - 2 VIEW COMPARISON:  04/01/2020 FINDINGS: Normal heart size, mediastinal contours, and pulmonary vascularity. Atherosclerotic calcification aorta. Patchy airspace infiltrates bilaterally increased from previous exam question multifocal pneumonia. Decreased RIGHT pleural effusion and basilar atelectasis. Tiny RIGHT pneumothorax following thoracentesis. No acute osseous findings. Prior cervical spine surgery and mild thoracolumbar scoliosis noted. IMPRESSION: Tiny RIGHT pneumothorax post thoracentesis. Decreased RIGHT pleural effusion and basilar atelectasis. Patchy airspace infiltrates bilaterally question multifocal pneumonia. Critical Value/emergent results were called by telephone at the time  of interpretation on 05/20/2020 at 1022 hrs to Penn Wynne at [facility], who verbally acknowledged these results. Electronically Signed   By: Lavonia Dana M.D.   On: 05/20/2020 10:23   DG Chest 1V REPEAT Same Day  Result Date: 05/20/2020 CLINICAL DATA:  Status post right thoracentesis EXAM: CHEST -  1 VIEW SAME DAY COMPARISON:  Film from earlier in the same day. FINDINGS: Cardiac shadow is stable. Patchy airspace opacities are noted and increased particularly on the right following thoracentesis. Slight increase in right-sided pleural effusion is noted. The small apical pneumothorax has increased slightly in the interval from the prior exam. No bony abnormality is noted. IMPRESSION: Slight increase in right-sided pneumothorax. Slight increase in right-sided effusion. Increase in airspace opacity within the right lung which may represent some re-expansion edema These results will be called to the ordering clinician or representative by the Radiologist Assistant, and communication documented in the PACS or [employer]. Electronically Signed   By: Inez Catalina M.D.   On: 05/20/2020 12:24   DG Chest Port 1 View  Result Date: 05/01/2020 CLINICAL DATA:  Follow-up pneumothorax EXAM: PORTABLE CHEST 1 VIEW COMPARISON:  05/18/2020, 05/20/2020 FINDINGS: Interval insertion of right-sided chest drainage catheter, the pigtail is at the periphery of the right mid lung. There is decreased right pleural effusion with small residual. Right apical pneumothorax is also decreased without definitive pleural line on this exam. Worsening airspace disease in the left lower lung. Stable cardiomediastinal silhouette. IMPRESSION: 1. Interval insertion of right-sided chest drainage catheter with pigtail at the periphery of the right mid lung, there is interval decrease in right pleural effusion. Decreased right apical pneumothorax without definitive pleural line on the current exam. 2. Worsening airspace disease in  the left lower lung. Electronically Signed   By: Donavan Foil M.D.   On: 05/19/2020 17:03    Procedures .Critical Care Performed by: Varney Biles, MD Authorized by: Varney Biles, MD   Critical care provider statement:    Critical care time (minutes):  52   Critical care was necessary to treat or prevent imminent or life-threatening deterioration of the following conditions:  Respiratory failure   Critical care was time spent personally by me on the following activities:  Discussions with consultants, evaluation of patient's response to treatment, examination of patient, ordering and performing treatments and interventions, ordering and review of laboratory studies, ordering and review of radiographic studies, pulse oximetry, re-evaluation of patient's condition, obtaining history from patient or surrogate and review of old charts CHEST TUBE INSERTION  Date/Time: 05/27/2020 10:28 PM Performed by: Varney Biles, MD Authorized by: Varney Biles, MD   Consent:    Consent obtained:  Written   Consent given by:  Patient   Risks discussed:  Bleeding, incomplete drainage, nerve damage, pain and infection   Alternatives discussed:  No treatment Universal protocol:    Procedure explained and questions answered to patient or proxy's satisfaction: yes     Relevant documents present and verified: yes     Test results available and properly labeled: yes     Imaging studies available: yes     Required blood products, implants, devices, and special equipment available: yes     Site/side marked: yes     Immediately prior to procedure a time out was called: yes     Patient identity confirmed:  Arm band Pre-procedure details:    Skin preparation:  ChloraPrep   Preparation: Patient was prepped and draped in the usual sterile fashion   Anesthesia (see MAR for exact dosages):    Anesthesia method:  Local infiltration   Local anesthetic:  Lidocaine 1% WITH epi Procedure details:     Placement location:  R lateral   Scalpel size:  10   Tube size (Fr):  12   Ultrasound guidance: no  Tension pneumothorax: no     Tube connected to:  Suction   Drainage characteristics:  Yellow   Suture material:  2-0 silk   Dressing:  4x4 sterile gauze and petrolatum-impregnated gauze Post-procedure details:    Post-insertion x-ray findings: tube in good position     Patient tolerance of procedure:  Tolerated well, no immediate complications   (including critical care time)  Medications Ordered in ED Medications  sodium chloride flush (NS) 0.9 % injection 10 mL (10 mLs Intracatheter Given 05/06/2020 2017)  sodium chloride flush (NS) 0.9 % injection 10 mL (10 mLs Intracatheter Given 05/03/2020 2017)  acetaminophen (TYLENOL) tablet 650 mg (has no administration in time range)    Or  acetaminophen (TYLENOL) suppository 650 mg (has no administration in time range)  oxyCODONE (Oxy IR/ROXICODONE) immediate release tablet 5 mg (has no administration in time range)  HYDROmorphone (DILAUDID) injection 0.5-1 mg (1 mg Intravenous Given 05/15/2020 1726)  ondansetron (ZOFRAN) tablet 4 mg (has no administration in time range)    Or  ondansetron (ZOFRAN) injection 4 mg (has no administration in time range)  hydrOXYzine (ATARAX/VISTARIL) tablet 20 mg (has no administration in time range)  PARoxetine (PAXIL) tablet 20 mg (20 mg Oral Given 05/17/2020 2001)  insulin glargine (LANTUS) injection 30 Units (30 Units Subcutaneous Given 05/12/2020 2220)  levothyroxine (SYNTHROID) tablet 150 mcg (has no administration in time range)  torsemide (DEMADEX) tablet 30 mg (30 mg Oral Given 05/07/2020 2001)  cefTRIAXone (ROCEPHIN) 1 g in sodium chloride 0.9 % 100 mL IVPB (0 g Intravenous Stopped 05/17/2020 2102)  azithromycin (ZITHROMAX) 500 mg in sodium chloride 0.9 % 250 mL IVPB (0 mg Intravenous Stopped 05/20/2020 2218)  insulin aspart (novoLOG) injection 0-15 Units (has no administration in time range)  insulin aspart (novoLOG)  injection 0-5 Units (0 Units Subcutaneous Not Given 05/27/2020 2206)    ED Course  I have reviewed the triage vital signs and the nursing notes.  Pertinent labs & imaging results that were available during my care of the patient were reviewed by me and considered in my medical decision making (see chart for details).    MDM Rules/Calculators/A&P                          79 year old female comes in a chief complaint of chest pain and shortness of breath.  She has history of hepatic hydropneumothorax and is status post thoracentesis from yesterday.  She is noted to have worsening apical pneumothorax today compared to the x-ray yesterday.  It appears that she has possible iatrogenic pneumothorax versus poor reexpansion of the lung.  I discussed the case with pulmonary team.  They will manage the chest tube.  Patient consented for the chest tube and a pigtail catheter was placed without any difficulty.  Patient immediately had fluid drainage as well.  She appears more comfortable.  Stable for admission.  Pulmonary critical care team to discuss palliative treatment options with the patient.  Final Clinical Impression(s) / ED Diagnoses Final diagnoses:  Postprocedural pneumothorax  Pleural effusion    Rx / DC Orders ED Discharge Orders    None       Varney Biles, MD 05/20/2020 2232

## 2020-05-21 NOTE — ED Notes (Signed)
Consent for chest tube placement obtained. Form signed by pt's sister.

## 2020-05-21 NOTE — ED Notes (Signed)
Lab to add on procalcitonin

## 2020-05-21 NOTE — Consult Note (Addendum)
NAME:  Ashlee Kelly, MRN:  [id number], DOB:  [birth year], LOS: 0 ADMISSION DATE:  05/04/2020, CONSULTATION DATE:  05/12/2020 REFERRING MD:  Dr. 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 Underwent outpatient thoracentesis with primary pulmonologist Dr. Vaughan Browner 6/22 seen with hypoxia and pneumothorax 6/23 resulting in pigtail chest tube insertions by ED provider and admission to [facility] service. PCCM consulted for chest tube management   History of present illness   Ashlee Kelly is a 79 year old female with a past medical history significant for Ashlee Kelly, thyroid disease, thrombocytopenia, hypothyroidism, diabetes, cirrhosis, arthritis, and chronic kidney disease stage IV who presented to the emergency department today for hypoxia and pneumothorax.    On 6/23 patient presented to Dr. Vaughan Browner, primary pulmonologist for an urgent visit due to worsening dyspnea in office work-up revealed recurrent right hepatic hydropneumothorax in the setting of Nash cirrhosis.  Thoracentesis was performed in office with 2 L of straw-colored fluid removed.  Post procedure patient experienced pain with inhalation, shortness of breath and cough resulting in stat chest x-ray.  Chest x-ray in office revealed a tiny right pneumothorax, patient was observed in clinic several hours with stable repeat chest x-ray therefore patient was discharged home.   6/24 as instructed patient presented for repeat chest x-ray to evaluate pneumothorax which revealed mildly increased size of small right apical pneumothorax and increased moderate right pleural effusion.  This in combination with hypoxia with SPO2 sat of 70% resulted in placement of pigtail chest tube in ED by ED provider. On further evaluation it appears that the patient was experiencing a pneumothorax ex vacuo post thoracentesis.   Patient tolerated procedure well and was  admitted under hospitalist services.  PCCM consulted for chest tube management.   Past Medical History  Nash Thrombocytopenia Hypothyroidism Diabetes Cancer Chronic kidney disease stage 4  Significant Hospital Events   Admitted 6/23, pigtail chest tube placed in ED on admisison   Consults:  PCCM  Palliative consult   Procedures:  Pigtail chest tube placement by EDP 6/23  Significant Diagnostic Tests:  CXR 6/23 > 1. Mildly increased size of small right apical pneumothorax. 2. Increased moderate right pleural effusion. 3. Bilateral airspace disease with mild progression on the right.  Micro Data:    Antimicrobials:     Interim history/subjective:  Lying in bed at the end of pigtail chest tube insertion  Objective   Blood pressure (!) 117/49, pulse (!) 113, temperature 97.7 F (36.5 C), temperature source Oral, resp. rate (!) 24, SpO2 90 %.       No intake or output data in the 24 hours ending 05/20/2020 1557 There were no vitals filed for this visit.  Examination: General: Chronically ill appearing deconditioned elderly female lying in bed in mild respiratory discomfort  HEENT: Ashlee Kelly/AT, MM pink/moist, PERRL, sclera non-icteric   Neuro: Alert and oriented x3, non-focal  CV: s1s2 regular rate and rhythm, no murmur, rubs, or gallops,  PULM:  Diminished right side, no added breath sound, mild tachypnea in flat position for procedure  GI: soft, bowel sounds active in all 4 quadrants, non-tender, non-distended Extremities: warm/dry, no edema  Skin: no rashes or lesions  Resolved Hospital Problem list     Assessment & Plan:  Pneumothorax ex vacuo post thoracentesis.  Acute on chronic hypoxic respiratory distress -6/23 patient underwent in office thoracentesis with 2L removed, on 6/23 it appears that the patient developed a pneumothorax  ex vacuo post thoracentesis.  CAP -Bilateral infiltrates seen on CXR  P: Pigtail chest tube placed by EDP, continue to -20cm suction    Chest tube order set Flush pigtail chest tube to ensure patency  Given end stage cirrhosis with recurrent hydrothorax, palliative care consult is applicable  Follow intermittent CXR, repeat in AM Watch renal function closely hepatorenal syndrome given excessive volume removal  Continue PO antibiotics   Best practice:  Diet: Cardiac diet Pain/Anxiety/Delirium protocol (if indicated): PRNs VAP protocol (if indicated): No applicable  DVT prophylaxis: Sub heparin GI prophylaxis: PPI Glucose control: SSI Mobility: Up with assistance  Code Status: Full Family Communication: Updated at bedside  Disposition: Floor  Labs   CBC: Recent Labs  Lab 05/07/2020 1500  WBC 17.8*  HGB 10.8*  HCT 33.5*  MCV 98.8  PLT 129*    Basic Metabolic Panel: Recent Labs  Lab 05/09/2020 1500  NA 139  K 3.9  CL 108  CO2 18*  GLUCOSE 92  BUN 67*  CREATININE 3.72*  CALCIUM 8.3*   GFR: Estimated Creatinine Clearance: 11.9 mL/min (A) (by C-G formula based on SCr of 3.72 mg/dL (H)). Recent Labs  Lab 05/20/2020 1500  WBC 17.8*    Liver Function Tests: Recent Labs  Lab 05/13/2020 1500  AST 39  ALT 16  ALKPHOS 76  BILITOT 3.4*  PROT 5.1*  ALBUMIN 1.9*   No results for input(s): LIPASE, AMYLASE in the last 168 hours. No results for input(s): AMMONIA in the last 168 hours.  ABG No results found for: PHART, PCO2ART, PO2ART, HCO3, TCO2, ACIDBASEDEF, O2SAT   Coagulation Profile: No results for input(s): INR, PROTIME in the last 168 hours.  Cardiac Enzymes: No results for input(s): CKTOTAL, CKMB, CKMBINDEX, TROPONINI in the last 168 hours.  HbA1C: Hgb A1c MFr Bld  Date/Time Value Ref Range Status  04/16/2020 03:40 PM 5.6 4.6 - 6.5 % Final    Comment:    Glycemic Control Guidelines for People with Diabetes:Non Diabetic:  <6%Goal of Therapy: <7%Additional Action Suggested:  >8%     CBG: No results for input(s): GLUCAP in the last 168 hours.  Review of Systems: Positive in bold   Gen:  Denies fever, chills, weight change, fatigue, night sweats HEENT: Denies blurred vision, double vision, hearing loss, tinnitus, sinus congestion, rhinorrhea, sore throat, neck stiffness, dysphagia PULM: Denies shortness of breath, cough, sputum production, hemoptysis, wheezing CV: Denies chest pain, edema, orthopnea, paroxysmal nocturnal dyspnea, palpitations GI: Denies abdominal pain, nausea, vomiting, diarrhea, hematochezia, melena, constipation, change in bowel habits GU: Denies dysuria, hematuria, polyuria, oliguria, urethral discharge Endocrine: Denies hot or cold intolerance, polyuria, polyphagia or appetite change Derm: Denies rash, dry skin, scaling or peeling skin change Heme: Denies easy bruising, bleeding, bleeding gums Neuro: Denies headache, numbness, weakness, slurred speech, loss of memory or consciousness  Past Medical History  She,  has a past medical history of Arthritis, Cancer ([place]), Chronic kidney disease, Cirrhosis ([place]), Depression, Diabetes ([place]), Heart murmur, Hepatic encephalopathy ([place]), Hypothyroidism, NASH (nonalcoholic steatohepatitis), Thrombocytopenia ([place]), and Thyroid disease.   Surgical History    Past Surgical History:  Procedure Laterality Date  . APPENDECTOMY    . BACK SURGERY     x3  . CHOLECYSTECTOMY    . FOOT NEUROMA SURGERY Bilateral   . PARACENTESIS    . PARTIAL HYSTERECTOMY    . THORACENTESIS    . TIPS PROCEDURE    . TONSILLECTOMY       Social History   reports that she quit  smoking about 6 months ago. Her smoking use included cigarettes. She has a 2.50 pack-year smoking history. She has never used smokeless tobacco. She reports previous alcohol use. She reports previous drug use.   Family History   Her family history includes Lung cancer in her father.   Allergies Allergies  Allergen Reactions  . Celebrex [Celecoxib]      Home Medications  Prior to Admission medications   Medication Sig Start Date End Date Taking? Authorizing  Provider  allopurinol (ZYLOPRIM) 100 MG tablet Take 100 mg by mouth daily.   Yes [provider]  amoxicillin-clavulanate (AUGMENTIN) 875-125 MG tablet Take 1 tablet by mouth 2 (two) times daily. 05/20/20  Yes Mannam, Praveen, MD  benzonatate (TESSALON) 100 MG capsule Take 1 capsule (100 mg total) by mouth 3 (three) times daily as needed for cough. 04/16/20  Yes Burns, Claudina Lick, MD  hydrOXYzine (ATARAX/VISTARIL) 10 MG tablet Take 2 tablets (20 mg total) by mouth at bedtime as needed for itching (insomnia). 04/16/20  Yes Burns, Claudina Lick, MD  LANTUS SOLOSTAR 100 UNIT/ML Solostar Pen Inject 30 Units into the skin at bedtime.  04/01/20  Yes [provider]  levothyroxine (SYNTHROID) 150 MCG tablet Take 150 mcg by mouth daily before breakfast.   Yes [provider]  Misc Natural Products (OSTEO BI-FLEX/5-LOXIN ADVANCED) TABS Take 1 tablet by mouth daily.   Yes [provider]  Multiple Vitamin (MULTIVITAMIN ADULT PO) Take 1 tablet by mouth every evening.   Yes [provider]  ondansetron (ZOFRAN) 4 MG tablet Take 1 tablet (4 mg total) by mouth every 8 (eight) hours as needed for nausea. 03/20/20  Yes Carlis Stable, NP  PARoxetine (PAXIL) 20 MG tablet Take 1 tablet (20 mg total) by mouth daily. 03/19/20  Yes Burns, Claudina Lick, MD  torsemide (DEMADEX) 20 MG tablet Take 1.5 tablets (30 mg total) by mouth daily. Patient taking differently: Take 20 mg by mouth every evening.  04/01/20  Yes Mannam, Praveen, MD  blood glucose meter kit and supplies KIT Dispense based on patient and insurance preference. Use to check blood sugar 3 times daily E11.9 04/08/20   Binnie Rail, MD  silver sulfADIAZINE (SILVADENE) 1 % cream Apply 1 application topically daily. Patient not taking: Reported on 05/02/2020 04/16/20   Binnie Rail, MD     Signature:  Johnsie Cancel, NP-C [facility] Contact / Pager information can be found on Amion  05/06/2020, 5:10  PM   PCCM:  79 yo NASH cirrhosis, end stage, s/p TIPS with recurrent hepatic hydrothorax, recent thora in office with excavuo-hydropneumothorax. Presents to ED. ED place 93f pigtail. PCCM consulted for chest tube management   BP (!) 117/49 (BP Location: Right Arm)   Pulse (!) 113   Temp 97.7 F (36.5 C) (Oral)   Resp (!) 24   SpO2 90%   Gen: elderly fm, resting in bed HENT: tracking appropriately, NCAT  Heart: RRR, s1 s2 Lungs: absent breath sounds on the right, clear on the left  Abd: soft nt nd  Labs reviewed  CXR: reviewed, excauo hydro ptx, recurrent right sided effusion   A:  Recurrent hepatic hydrothorax NASH cirrhosis s/p TIPS Failed increased management with diuretics  - this is an end stage disease - there is no additional management options except palliation  - she has received several thoracentesis - only other option would be pleurex catheter placement   P: Palliative consult  Recommend pleur-ex placement  Likely  transition to home hospice care at discharge  Would keep on home diuretic regimen.  Discussed with patient and sister at bedside   Garner Nash, DO [facility] 05/22/2020 5:31 PM

## 2020-05-21 NOTE — ED Notes (Signed)
Procedure explained to patient, opportunity for questioning provided, Pigtail Chest tube placed by Dr. Kathrynn Humble. Suction set to 58mHg.

## 2020-05-21 NOTE — ED Triage Notes (Signed)
Pt reports she had a thoracentesis yesterday, reports she was told to follow up today and provider saw a pneumothorax on xray. Pt reports some chest pain and sob, O2 sat 77% on room air, 90% on 4L via [place].

## 2020-05-22 ENCOUNTER — Inpatient Hospital Stay (HOSPITAL_COMMUNITY): Payer: Medicare Other

## 2020-05-22 DIAGNOSIS — N179 Acute kidney failure, unspecified: Secondary | ICD-10-CM

## 2020-05-22 DIAGNOSIS — J9601 Acute respiratory failure with hypoxia: Secondary | ICD-10-CM

## 2020-05-22 DIAGNOSIS — K7460 Unspecified cirrhosis of liver: Secondary | ICD-10-CM

## 2020-05-22 DIAGNOSIS — N184 Chronic kidney disease, stage 4 (severe): Secondary | ICD-10-CM

## 2020-05-22 LAB — CBG MONITORING, ED
Glucose-Capillary: 93 mg/dL (ref 70–99)
Glucose-Capillary: 80 mg/dL (ref 70–99)

## 2020-05-22 LAB — CBC
HCT: 30.9 % — ABNORMAL LOW (ref 36.0–46.0)
Hemoglobin: 9.3 g/dL — ABNORMAL LOW (ref 12.0–15.0)
MCH: 32.3 pg (ref 26.0–34.0)
MCHC: 30.1 g/dL (ref 30.0–36.0)
MCV: 107.3 fL — ABNORMAL HIGH (ref 80.0–100.0)
Platelets: 59 10*3/uL — ABNORMAL LOW (ref 150–400)
RBC: 2.88 MIL/uL — ABNORMAL LOW (ref 3.87–5.11)
RDW: 19.1 % — ABNORMAL HIGH (ref 11.5–15.5)
WBC: 7.1 10*3/uL (ref 4.0–10.5)
nRBC: 0.3 % — ABNORMAL HIGH (ref 0.0–0.2)

## 2020-05-22 LAB — COMPREHENSIVE METABOLIC PANEL
ALT: 18 U/L (ref 0–44)
AST: 33 U/L (ref 15–41)
Albumin: 1.5 g/dL — ABNORMAL LOW (ref 3.5–5.0)
Alkaline Phosphatase: 59 U/L (ref 38–126)
Anion gap: 14 (ref 5–15)
BUN: 73 mg/dL — ABNORMAL HIGH (ref 8–23)
CO2: 16 mmol/L — ABNORMAL LOW (ref 22–32)
Calcium: 8 mg/dL — ABNORMAL LOW (ref 8.9–10.3)
Chloride: 110 mmol/L (ref 98–111)
Creatinine, Ser: 3.81 mg/dL — ABNORMAL HIGH (ref 0.44–1.00)
GFR calc Af Amer: 12 mL/min — ABNORMAL LOW (ref >60)
GFR calc non Af Amer: 11 mL/min — ABNORMAL LOW (ref >60)
Glucose, Bld: 100 mg/dL — ABNORMAL HIGH (ref 70–99)
Potassium: 3.9 mmol/L (ref 3.5–5.1)
Sodium: 140 mmol/L (ref 135–145)
Total Bilirubin: 2 mg/dL — ABNORMAL HIGH (ref 0.3–1.2)
Total Protein: 4.4 g/dL — ABNORMAL LOW (ref 6.5–8.1)

## 2020-05-22 LAB — PROCALCITONIN: Procalcitonin: 1.21 ng/mL

## 2020-05-22 LAB — GLUCOSE, CAPILLARY: Glucose-Capillary: 276 mg/dL — ABNORMAL HIGH (ref 70–99)

## 2020-05-22 MED ORDER — LACTATED RINGERS IV BOLUS
500.0000 mL | Freq: Once | INTRAVENOUS | Status: AC
  Administered 2020-05-22: 500 mL via INTRAVENOUS

## 2020-05-22 NOTE — Progress Notes (Signed)
PROGRESS NOTE  Ashlee Kelly WUJ:[id number] DOB: 1941/05/02 DOA: 05/24/2020 PCP: Binnie Rail, MD  HPI/Recap of past 30 hours: 79 year old female past medical history of hypothyroidism, Karlene Lineman cirrhosis, recurrent right hepatic hydrothorax, stage IV chronic kidney disease and diabetes mellitus presented to emergency room on 6/23 with complaints of shortness of breath.  She had had a thoracentesis the day prior at her pulmonologist office where they removed 2 L of fluid.  At that time, bilateral infiltrates noted on chest x-ray and she was started on Augmentin.  Patient tolerated procedure well, but then that evening she had shortness of breath which progressed and so she came into the emergency room.  Patient had TIPS procedure done on 3/15 and also found to have a DVT in the right upper extremity from a PICC line, but no anticoagulation was done due to high risk of bleeding from liver disease.  In the emergency room, patient found to be hypoxic requiring oxygen, 4 L.  Emergency room physician placed a chest tube.  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  Patient was admitted to the hospitalist service.  Started on Rocephin and Zithromax for infiltrates noted on x-ray.  Critical care followed up today and have clamped chest tube.  Plan will be for Pleurx catheter tomorrow.  Palliative care consulted who will see patient and help transition to hospice.  Patient and son aware of plan.  She herself complains of some mild pain or shortness of breath although much improved than when she first came in.  Otherwise she is doing okay.  Assessment/Plan: Principal Problem:  Pleural effusion on right, recurrent due to hepatic hydrothorax (from Sig[place] cirrhosis with ascites) causing acute respiratory failure with hypoxia: Status  post chest tube placement.  For Pleurx catheter tomorrow.  Unfortunately, this is more for symptom control and cannot fix underlying problem.  Awaiting palliative care for transition to hospice.    Pneumothorax: Incidentally noted on chest x-ray following chest tube.  Stable. Active Problems:  Recent upper extremity DVT: No anticoagulation secondary to liver disease and high risk of bleeding.  Acute kidney injury in the setting of CKD (chronic kidney disease) stage 4, GFR 15-29 ml/min ([place]): Worsening secondary to her end-stage disease.  Following Pleurx catheter, likely needs to be made comfort care.   Hypothyroidism: Continue Synthroid.    Diabetes ([place]): Watching CBGs.     Aortic valve stenosis, mild-mod    Thrombocytopenia ([place]): Secondary to liver disease   Portal hypertension ([place]): Secondary to liver disease  Anemia of chronic renal disease: Stable.  Presumed protein calorie malnutrition: Patient reports poor appetite.  We will have nutrition assess.  Presumed community-acquired pneumonia: We will check procalcitonin.  Continue Rocephin and Zithromax.  Code Status: DNR  Family Communication: Son at the bedside  Disposition Plan: Home hopefully with hospice following Pleurx catheter.   Consultants:  Palliative care  Critical care  Procedures:  Placement of chest tubes 6/23  Antimicrobials:  Rocephin and Zithromax 6/23-present  DVT prophylaxis: SCDs   Objective: Vitals:   05/22/20 1729 05/22/20 1734  BP:  (!) 105/41  Pulse:  (!) 104  Resp:  19  Temp: 97.7 F (36.5 C)   SpO2:  96%    Intake/Output Summary (Last 24 hours) at 05/22/2020 1751 Last data filed at 05/22/2020 0942 Gross per 24 hour  Intake 850 ml  Output 600 ml  Net 250 ml   There were no vitals filed for this visit. There is no height or weight on file to calculate BMI.  Exam:   General: Alert and oriented x3, fatigued  HEENT: Normocephalic and atraumatic, mucous membranes are  dry  Neck: Supple, no JVD  Cardiovascular: Regular rhythm, S1-S2, borderline tachycardia  Respiratory: Decreased breath sounds throughout, chest tube noted  Abdomen: Soft, nontender, nondistended, hypoactive bowel sounds  Musculoskeletal: No clubbing or cyanosis, 1+ pitting edema  Psychiatry: Appropriate, no evidence of psychoses   Data Reviewed: CBC: Recent Labs  Lab 05/04/2020 1500 05/22/20 0436  WBC 17.8* 7.1  HGB 10.8* 9.3*  HCT 33.5* 30.9*  MCV 98.8 107.3*  PLT 129* 59*   Basic Metabolic Panel: Recent Labs  Lab 05/27/2020 1500 05/24/2020 1731 05/22/20 0436  NA 139  --  140  K 3.9  --  3.9  CL 108  --  110  CO2 18*  --  16*  GLUCOSE 92  --  100*  BUN 67*  --  73*  CREATININE 3.72*  --  3.81*  CALCIUM 8.3*  --  8.0*  MG  --  1.8  --   PHOS  --  4.6  --    GFR: Estimated Creatinine Clearance: 11.6 mL/min (A) (by C-G formula based on SCr of 3.81 mg/dL (H)). Liver Function Tests: Recent Labs  Lab 05/20/2020 1500 05/22/20 0436  AST 39 33  ALT 16 18  ALKPHOS 76 59  BILITOT 3.4* 2.0*  PROT 5.1* 4.4*  ALBUMIN 1.9* 1.5*   No results for input(s): LIPASE, AMYLASE in the last 168 hours. No results for input(s): AMMONIA in the last 168 hours. Coagulation Profile: Recent Labs  Lab 05/22/2020 1731  INR 1.9*   Cardiac Enzymes: No results for input(s): CKTOTAL, CKMB, CKMBINDEX, TROPONINI in the last 168 hours. BNP (last 3 results) No results for input(s): PROBNP in the last 8760 hours. HbA1C: No results for input(s): HGBA1C in the last 72 hours. CBG: Recent Labs  Lab 05/17/2020 2148 05/22/20 0804 05/22/20 1157  GLUCAP 91 93 80   Lipid Profile: No results for input(s): CHOL, HDL, LDLCALC, TRIG, CHOLHDL, LDLDIRECT in the last 72 hours. Thyroid Function Tests: Recent Labs    05/11/2020 1731  TSH 0.463   Anemia Panel: No results for input(s): VITAMINB12, FOLATE, FERRITIN, TIBC, IRON, RETICCTPCT in the last 72 hours. Urine analysis: No results found for:  COLORURINE, APPEARANCEUR, LABSPEC, PHURINE, GLUCOSEU, HGBUR, BILIRUBINUR, KETONESUR, PROTEINUR, UROBILINOGEN, NITRITE, LEUKOCYTESUR Sepsis Labs: @LABRCNTIP (procalcitonin:4,lacticidven:4)  ) Recent Results (from the past 240 hour(s))  Culture, blood (routine x 2)     Status: None (Preliminary result)   Collection Time: 05/15/2020  5:29 PM   Specimen: BLOOD  Result Value Ref Range Status   Specimen Description BLOOD LEFT ANTECUBITAL  Final   Special Requests   Final    BOTTLES DRAWN AEROBIC AND ANAEROBIC Blood Culture results may not be optimal due to an inadequate volume of blood received in culture bottles   Culture   Final    NO GROWTH < 12 HOURS Performed at [facility], [place] [address], [place], [place] [address]    Report Status PENDING  Incomplete  SARS Coronavirus 2 by RT PCR (hospital order, performed in [facility] hospital lab) Nasopharyngeal Nasopharyngeal Swab     Status: None   Collection Time: 05/01/2020  5:39 PM   Specimen: Nasopharyngeal Swab  Result Value Ref Range Status  SARS Coronavirus 2 NEGATIVE NEGATIVE Final    Comment: (NOTE) SARS-CoV-2 target nucleic acids are NOT DETECTED.  The SARS-CoV-2 RNA is generally detectable in upper and lower respiratory specimens during the acute phase of infection. The lowest concentration of SARS-CoV-2 viral copies this assay can detect is 250 copies / mL. A negative result does not preclude SARS-CoV-2 infection and should not be used as the sole basis for treatment or other patient management decisions.  A negative result may occur with improper specimen collection / handling, submission of specimen other than nasopharyngeal swab, presence of viral mutation(s) within the areas targeted by this assay, and inadequate number of viral copies (<250 copies / mL). A negative result must be combined with clinical observations, patient history, and epidemiological information.  Fact Sheet for Patients:     StrictlyIdeas.no  Fact Sheet for Healthcare Providers: BankingDealers.co.za  This test is not yet approved or  cleared by the [place] FDA and has been authorized for detection and/or diagnosis of SARS-CoV-2 by FDA under an Emergency Use Authorization (EUA).  This EUA will remain in effect (meaning this test can be used) for the duration of the COVID-19 declaration under Section 564(b)(1) of the Act, 21 U.S.C. section 360bbb-3(b)(1), unless the authorization is terminated or revoked sooner.  Performed at [facility], [place] [address] [place], [place] [address]   Culture, blood (routine x 2)     Status: None (Preliminary result)   Collection Time: 05/20/2020  6:00 PM   Specimen: BLOOD LEFT ARM  Result Value Ref Range Status   Specimen Description BLOOD LEFT ARM  Final   Special Requests   Final    BOTTLES DRAWN AEROBIC ONLY Blood Culture results may not be optimal due to an inadequate volume of blood received in culture bottles   Culture   Final    NO GROWTH < 12 HOURS Performed at [facility], [place] [address] [place], [place] [address]    Report Status PENDING  Incomplete      Studies: DG CHEST PORT 1 VIEW  Addendum Date: 05/22/2020   ADDENDUM REPORT: 05/22/2020 12:15 ADDENDUM: The small pneumothorax and retraction of the tube with some of the sideholes within the pleural space and some of the sideholes of the chest tube within the chest wall. These results were called by telephone at the time of interpretation on 05/22/2020 at 12:13 pm to provider Dr. Gevena Barre, Who verbally acknowledged these results. Electronically Signed   By: Zetta Bills M.D.   On: 05/22/2020 12:15   Result Date: 05/22/2020 CLINICAL DATA:  History of pleural effusion with pain a on inspiration. EXAM: PORTABLE CHEST 1 VIEW COMPARISON:  05/20/2020 FINDINGS: RIGHT-sided chest tube appears to been retracted slightly since the previous exam,  sideholes for the chest tube now outside the pleural space in the RIGHT chest wall. Small RIGHT apical pneumothorax is now visible measuring approximately a cm at the RIGHT lung apex near complete resolution of RIGHT pleural fluid, diminished since the previous radiograph. Redemonstration of LEFT lower lobe opacity with interstitial and airspace disease at the LEFT lung base. Cardiomediastinal contours and hilar structures are stable. Signs of cervical spinal fusion. Posttraumatic changes in the LEFT proximal humerus as before. IMPRESSION: 1. Small RIGHT apical pneumothorax is now visible measuring approximately a cm at the RIGHT lung apex. 2. RIGHT-sided chest tube has been retracted slightly since the previous exam, sideholes for the pleural space in the RIGHT chest wall. 3. Stable LEFT lower lobe  opacity. 4. A call is out to the referring provider to further discuss findings in the above case. Electronically Signed: By: Zetta Bills M.D. On: 05/22/2020 11:54    Scheduled Meds: . insulin aspart  0-15 Units Subcutaneous TID WC  . insulin aspart  0-5 Units Subcutaneous QHS  . insulin glargine  30 Units Subcutaneous QHS  . levothyroxine  150 mcg Oral QAC breakfast  . PARoxetine  20 mg Oral Daily  . sodium chloride flush  10 mL Intracatheter Q8H  . sodium chloride flush  10 mL Intracatheter Q8H  . torsemide  30 mg Oral Daily    Continuous Infusions: . azithromycin Stopped (05/13/2020 2218)  . cefTRIAXone (ROCEPHIN)  IV Stopped (05/18/2020 2102)     LOS: 1 day     Annita Brod, MD [facility]   05/22/2020, 5:51 PM

## 2020-05-22 NOTE — Significant Event (Addendum)
Rapid Response Event Note  Overview: Called d/t SOB/SpO2-70-80s on 2L [place]. Per RN, pt now on 9L HFNC with SpO2-94% and SOB resolving.  Pt is s/p thoracentesis with 2L fluid removal in MD office on 6/23 with R ptx post procedure. After observing pt, she was sent home. On 6/24, pt went back to office for repeat CXR which showed increasing size of ptx and increased pleural effusion. Pt at this time was hypoxic at 70% and went to ED for pigtail placement. CT has been clamped since this AM per MD order. Plan is to have a pleurx cathether placed in AM.    Initial Focused Assessment: Pt laying in bed with eyes closed, in no visible distress. Pt awakens easily, is alert and oriented, and says she is SOB. She says this SOB is similar to the SOB she experienced just prior to having her CT placed. Bilateral breath sounds diminished t/o. Skin warm and dry. CT site WNL, CT clamped at stopcock.  T-97.9, HR-105(ST), BP-114/44, RR-16, SpO2-94% on 9L HFNC. Attempted to decrease HFNC to 2L and SpO2 dropped to 88%. HFNC now on 5L with SpO2-94%. PCCM notified-orders to unclamp CT-this yielded approx 280cc serous fluid and get PCXR.  After CT unclamped, pt says she is no longer SOB.  Interventions: CT unclamped-280cc serous fluid drainage out [place] (if not transferred): Pt is in no distress at this time. Leave CT unclamped and await PCXR results. Relay results to PCCM MD [phone]). May wean HFNC if SOB and SpO2 allows. Call RRT if further assistance needed.  Event Summary:  Dr. Lorenda Hatchet) called at [place]: [phone] Arrived: 2220 Ended: 2303   Addendum:   2313-PCXR results:   This xray done after unclamping CT and draining fluid.  1. Slightly decreased size of right apical pneumothorax. 2. Unchanged position of right chest tube with proximal side holes outside the pleural space. 3. Worsened right basilar opacities.  Unchanged left lung opacities.  Bedside RN relayed results to PCCM MD-will  keep CT unclamped tonight.    0007-Called back d/t increased FiO2 demand and confusion. Pt now back on 9L HFNC with SpO2-94%. HR-104, BP-93/49, RR-18, SpO2-94% on 9L HFNC, CBG-273, NIH-3 for confusion. Pt says she is tired and needs occasional stimulation to participate in exam. No focal deficits present, just confusion and delayed responses.   Bedside notified TRH: ABG, Ammonia, CBC, BMP, Mg, Phos   Garvin Ellena Anderson

## 2020-05-22 NOTE — Progress Notes (Addendum)
NAME:  Ashlee Kelly, MRN:  [id number], DOB:  1941/07/11, LOS: 1 ADMISSION DATE:  05/20/2020, CONSULTATION DATE:  05/01/2020 REFERRING MD:  Dr. 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 Underwent outpatient thoracentesis with primary pulmonologist Dr. Vaughan Browner 6/22 seen with hypoxia and pneumothorax 6/23 resulting in pigtail chest tube insertions by ED provider and admission to [facility] service. PCCM consulted for chest tube management   History of present illness   Ashlee Kelly is a 79 year old female with a past medical history significant for Karlene Lineman, thyroid disease, thrombocytopenia, hypothyroidism, diabetes, cirrhosis, arthritis, and chronic kidney disease stage IV who presented to the emergency department today for hypoxia and pneumothorax.    On 6/23 patient presented to Dr. Vaughan Browner, primary pulmonologist for an urgent visit due to worsening dyspnea in office work-up revealed recurrent right hepatic hydropneumothorax in the setting of Nash cirrhosis.  Thoracentesis was performed in office with 2 L of straw-colored fluid removed.  Post procedure patient experienced pain with inhalation, shortness of breath and cough resulting in stat chest x-ray.  Chest x-ray in office revealed a tiny right pneumothorax, patient was observed in clinic several hours with stable repeat chest x-ray therefore patient was discharged home.   6/24 as instructed patient presented for repeat chest x-ray to evaluate pneumothorax which revealed mildly increased size of small right apical pneumothorax and increased moderate right pleural effusion.  This in combination with hypoxia with SPO2 sat of 70% resulted in placement of pigtail chest tube in ED by ED provider. On further evaluation it appears that the patient was experiencing a pneumothorax ex vacuo post thoracentesis.   Patient tolerated procedure well and was  admitted under hospitalist services.  PCCM consulted for chest tube management.   Past Medical History  Nash Thrombocytopenia Hypothyroidism Diabetes Cancer Chronic kidney disease stage 4  Significant Hospital Events   Admitted 6/23, pigtail chest tube placed in ED on admisison   Consults:  PCCM  Palliative consult   Procedures:  Pigtail chest tube placement by EDP 6/23  Significant Diagnostic Tests:  CXR 6/23 > 1. Mildly increased size of small right apical pneumothorax. 2. Increased moderate right pleural effusion. 3. Bilateral airspace disease with mild progression on the right.  Micro Data:    Antimicrobials:    623 Zithromax 623 8 Rocephin Interim history/subjective:  Complaining of intense pain from chest Objective   Blood pressure (!) 105/44, pulse 98, temperature 97.7 F (36.5 C), temperature source Oral, resp. rate 15, SpO2 100 %.        Intake/Output Summary (Last 24 hours) at 05/22/2020 1021 Last data filed at 05/22/2020 0942 Gross per 24 hour  Intake 850 ml  Output 3250 ml  Net -2400 ml   There were no vitals filed for this visit.  Examination: 79 year old female who is in obvious distress and chest pain No JVD or lymphadenopathy is appreciated Right chest tube is patent without air leak Decreased breath sounds throughout Abdomen distended Extremities warm positive edema  Resolved Hospital Problem list     Assessment & Plan:  Pneumothorax ex vacuo post thoracentesis.  Acute on chronic hypoxic respiratory distress -6/23 patient underwent in office thoracentesis with 2L removed, on 6/23 it appears that the patient developed a pneumothorax ex vacuo post thoracentesis.  CAP -Bilateral infiltrates seen on CXR  P:  Continue chest tube Intermittent flushing Palliative care consult Monitor renal function with hepatorenal syndrome Continue antimicrobial  therapy   Best practice:  Diet: Cardiac diet Pain/Anxiety/Delirium protocol (if  indicated): PRNs VAP protocol (if indicated): No applicable  DVT prophylaxis: Sub heparin GI prophylaxis: PPI Glucose control: SSI Mobility: Up with assistance  Code Status: Full Family Communication: Updated at bedside  Disposition: Floor  Labs   CBC: Recent Labs  Lab 05/20/2020 1500 05/22/20 0436  WBC 17.8* 7.1  HGB 10.8* 9.3*  HCT 33.5* 30.9*  MCV 98.8 107.3*  PLT 129* 59*    Basic Metabolic Panel: Recent Labs  Lab 05/20/2020 1500 05/05/2020 1731 05/22/20 0436  NA 139  --  140  K 3.9  --  3.9  CL 108  --  110  CO2 18*  --  16*  GLUCOSE 92  --  100*  BUN 67*  --  73*  CREATININE 3.72*  --  3.81*  CALCIUM 8.3*  --  8.0*  MG  --  1.8  --   PHOS  --  4.6  --    GFR: Estimated Creatinine Clearance: 11.6 mL/min (A) (by C-G formula based on SCr of 3.81 mg/dL (H)). Recent Labs  Lab 05/18/2020 1500 05/25/2020 1731 05/05/2020 1904 05/10/2020 2111 05/22/20 0436  PROCALCITON  --   --  1.24  --   --   WBC 17.8*  --   --   --  7.1  LATICACIDVEN  --  4.4*  --  2.1*  --     Liver Function Tests: Recent Labs  Lab 05/18/2020 1500 05/22/20 0436  AST 39 33  ALT 16 18  ALKPHOS 76 59  BILITOT 3.4* 2.0*  PROT 5.1* 4.4*  ALBUMIN 1.9* 1.5*   No results for input(s): LIPASE, AMYLASE in the last 168 hours. No results for input(s): AMMONIA in the last 168 hours.  ABG No results found for: PHART, PCO2ART, PO2ART, HCO3, TCO2, ACIDBASEDEF, O2SAT   Coagulation Profile: Recent Labs  Lab 05/05/2020 1731  INR 1.9*    Cardiac Enzymes: No results for input(s): CKTOTAL, CKMB, CKMBINDEX, TROPONINI in the last 168 hours.  HbA1C: Hgb A1c MFr Bld  Date/Time Value Ref Range Status  04/16/2020 03:40 PM 5.6 4.6 - 6.5 % Final    Comment:    Glycemic Control Guidelines for People with Diabetes:Non Diabetic:  <6%Goal of Therapy: <7%Additional Action Suggested:  >8%     CBG: Recent Labs  Lab 05/08/2020 2148 05/22/20 0804  GLUCAP 91 93     Signature:    Richardson Landry Minor ACNP Acute Care  Nurse Practitioner Maryanna Shape Pulmonary/Critical Care Please consult Amion 05/22/2020, 10:22 AM   PCCM:  79 yo hepatic hydrothorax, recurrent, ptx-exvacuo s/p thora in office, edp placed 3f ct. Nearly 4L out since yesterday   BP (!) 105/44   Pulse 98   Temp 97.7 F (36.5 C) (Oral)   Resp 15   SpO2 100%   Gen: elderly fm, chronically ill  HENT: NCAT tracking Heart: RRR, s1 s2 Lungs: CTAB, diminished in the right  Ext: dependent edema in UE and LE   Labs reviewed  CXR: new pending, CT film looks good. The patient's images have been independently reviewed by me.    A: Right hepatic hydrothorax NASH Cirrhosis  S/p right pigtail 1110f  P: Pleurex catheter placement tomorrow 1pm CT clamped to allow reaccumulation  Once pleurex in place we will remove pigtail and she can go home  Would recommend arrangement of hospice care at discharge Patient and son agreeable  BrGarner NashDO [facility]  05/22/2020 11:39 AM

## 2020-05-22 NOTE — ED Notes (Signed)
Assisted pt with calling family.

## 2020-05-22 NOTE — Progress Notes (Addendum)
2150- Patient started to desat on [facility] holding at 88% and saying she was having trouble breathing. Had to put her on HFNC 9L.Rapid Response Nurse and Elink notified. Got orders to unclamp chest tube and XRAY, 280cc out. Patient denied shortness of breath after unclamping CT, HFNC down to 5L.    2320- Patient was not alert & oriented x4. She only knew her name and was able to follow commands. Had to increase 02 to 9L. Paged [facility] got orders for an ABG, ammonia, BMP, magnesium and phosphorus. Will continue to monitor patient. HR 104, BP 93/49, temp 98.4. Will continue to monitor patient.

## 2020-05-22 NOTE — ED Notes (Signed)
Diet requested.

## 2020-05-22 NOTE — ED Notes (Addendum)
PT chest tube stop cock is in off position per Dr. Valeta Harms. Sent Dr. Valeta Harms and Dr. Laurel Dimmer a message to put in order for closed CT.  From his note:  Pleurex catheter placement tomorrow 1pm CT clamped to allow reaccumulation  Once pleurex in place we will remove pigtail and she can go home  Would recommend arrangement of hospice care at discharge Patient and son agreeable  Garner Nash, DO [facility] 05/22/2020 11:39 AM

## 2020-05-23 ENCOUNTER — Inpatient Hospital Stay (HOSPITAL_COMMUNITY): Payer: Medicare Other

## 2020-05-23 ENCOUNTER — Encounter (HOSPITAL_COMMUNITY): Admission: EM | Disposition: E | Payer: Self-pay | Source: Home / Self Care | Attending: Internal Medicine

## 2020-05-23 DIAGNOSIS — Z9689 Presence of other specified functional implants: Secondary | ICD-10-CM

## 2020-05-23 DIAGNOSIS — Z7189 Other specified counseling: Secondary | ICD-10-CM

## 2020-05-23 DIAGNOSIS — Z515 Encounter for palliative care: Secondary | ICD-10-CM

## 2020-05-23 LAB — BLOOD GAS, ARTERIAL
Acid-base deficit: 7.6 mmol/L — ABNORMAL HIGH (ref 0.0–2.0)
Bicarbonate: 17.7 mmol/L — ABNORMAL LOW (ref 20.0–28.0)
Drawn by: 35043
FIO2: 56
O2 Saturation: 94.7 %
Patient temperature: 36.9
pCO2 arterial: 37.8 mmHg (ref 32.0–48.0)
pH, Arterial: 7.293 — ABNORMAL LOW (ref 7.350–7.450)
pO2, Arterial: 78.1 mmHg — ABNORMAL LOW (ref 83.0–108.0)

## 2020-05-23 LAB — COMPREHENSIVE METABOLIC PANEL
ALT: 17 U/L (ref 0–44)
AST: 40 U/L (ref 15–41)
Albumin: 1.5 g/dL — ABNORMAL LOW (ref 3.5–5.0)
Alkaline Phosphatase: 62 U/L (ref 38–126)
Anion gap: 15 (ref 5–15)
BUN: 80 mg/dL — ABNORMAL HIGH (ref 8–23)
CO2: 16 mmol/L — ABNORMAL LOW (ref 22–32)
Calcium: 7.8 mg/dL — ABNORMAL LOW (ref 8.9–10.3)
Chloride: 104 mmol/L (ref 98–111)
Creatinine, Ser: 4.07 mg/dL — ABNORMAL HIGH (ref 0.44–1.00)
GFR calc Af Amer: 12 mL/min — ABNORMAL LOW (ref >60)
GFR calc non Af Amer: 10 mL/min — ABNORMAL LOW (ref >60)
Glucose, Bld: 256 mg/dL — ABNORMAL HIGH (ref 70–99)
Potassium: 4.1 mmol/L (ref 3.5–5.1)
Sodium: 135 mmol/L (ref 135–145)
Total Bilirubin: 1.9 mg/dL — ABNORMAL HIGH (ref 0.3–1.2)
Total Protein: 4.2 g/dL — ABNORMAL LOW (ref 6.5–8.1)

## 2020-05-23 LAB — PHOSPHORUS: Phosphorus: 5.8 mg/dL — ABNORMAL HIGH (ref 2.5–4.6)

## 2020-05-23 LAB — CBC
HCT: 30.1 % — ABNORMAL LOW (ref 36.0–46.0)
Hemoglobin: 9.5 g/dL — ABNORMAL LOW (ref 12.0–15.0)
MCH: 31.7 pg (ref 26.0–34.0)
MCHC: 31.6 g/dL (ref 30.0–36.0)
MCV: 100.3 fL — ABNORMAL HIGH (ref 80.0–100.0)
Platelets: 96 10*3/uL — ABNORMAL LOW (ref 150–400)
RBC: 3 MIL/uL — ABNORMAL LOW (ref 3.87–5.11)
RDW: 19 % — ABNORMAL HIGH (ref 11.5–15.5)
WBC: 13.2 10*3/uL — ABNORMAL HIGH (ref 4.0–10.5)
nRBC: 0 % (ref 0.0–0.2)

## 2020-05-23 LAB — GLUCOSE, CAPILLARY
Glucose-Capillary: 148 mg/dL — ABNORMAL HIGH (ref 70–99)
Glucose-Capillary: 169 mg/dL — ABNORMAL HIGH (ref 70–99)
Glucose-Capillary: 185 mg/dL — ABNORMAL HIGH (ref 70–99)
Glucose-Capillary: 186 mg/dL — ABNORMAL HIGH (ref 70–99)
Glucose-Capillary: 273 mg/dL — ABNORMAL HIGH (ref 70–99)

## 2020-05-23 LAB — AMMONIA: Ammonia: 31 umol/L (ref 9–35)

## 2020-05-23 LAB — MAGNESIUM: Magnesium: 1.9 mg/dL (ref 1.7–2.4)

## 2020-05-23 SURGERY — CANCELLED PROCEDURE

## 2020-05-23 MED ORDER — TORSEMIDE 20 MG PO TABS
20.0000 mg | ORAL_TABLET | Freq: Every day | ORAL | Status: DC
  Administered 2020-05-24: 20 mg via ORAL
  Filled 2020-05-23 (×2): qty 1

## 2020-05-23 MED ORDER — ADULT MULTIVITAMIN W/MINERALS CH
1.0000 | ORAL_TABLET | Freq: Every day | ORAL | Status: DC
  Administered 2020-05-23 – 2020-05-24 (×2): 1 via ORAL
  Filled 2020-05-23 (×2): qty 1

## 2020-05-23 MED ORDER — ENSURE ENLIVE PO LIQD
237.0000 mL | Freq: Two times a day (BID) | ORAL | Status: DC
  Administered 2020-05-23 – 2020-05-24 (×3): 237 mL via ORAL

## 2020-05-23 MED ORDER — MIDODRINE HCL 5 MG PO TABS
5.0000 mg | ORAL_TABLET | Freq: Once | ORAL | Status: AC
  Administered 2020-05-23: 5 mg via ORAL
  Filled 2020-05-23: qty 1

## 2020-05-23 NOTE — Progress Notes (Signed)
Initial Nutrition Assessment  DOCUMENTATION CODES:   Not applicable  INTERVENTION:    Ensure Enlive po BID, each supplement provides 350 kcal and 20 grams of protein.  MVI with minerals daily.  NUTRITION DIAGNOSIS:   Increased nutrient needs related to chronic illness (NASH cirrhosis) as evidenced by estimated needs.  GOAL:   Patient will meet greater than or equal to 90% of their needs  MONITOR:   PO intake, Supplement acceptance  REASON FOR ASSESSMENT:   Consult Assessment of nutrition requirement/status  ASSESSMENT:   79 yo female admitted 6/23 with progressive SOB. S/P thoracentesis 6/22 with 2 L of fluid removed. PMH includes R hepatic hydrothorax d/t NASH cirrhosis, hypothyroidism, DM, CKD stage IV.   Chest tube placed in the ED, has now been clamped. Plans for Pleurx catheter today.   Palliative Care team has been consulted. Plans are for transition to hospice care at home.   Patient developed AMS overnight, possible hepatic encephalopathy. HFNC increased to 9 L.   Currently on a 2 gm sodium diet. Meal intakes not recorded.   Labs reviewed. BUN 80 (H), creat 4.07 (H), Phos 5.8 (H) CBG: 273-185  Medications reviewed and include novolog, lantus.  Weight changes are difficult to assess, given fluid retention associated with cirrhosis. Decreased intake reported on admission.   Patient is at increased nutrition risk, and likely malnourished. Unable to obtain enough information at this time for identification of malnutrition.   NUTRITION - FOCUSED PHYSICAL EXAM:  unable to complete  Diet Order:   Diet Order            Diet 2 gram sodium Room service appropriate? Yes; Fluid consistency: Thin  Diet effective now                 EDUCATION NEEDS:   Not appropriate for education at this time  Skin:  Skin Assessment: Reviewed RN Assessment  Last BM:  6/23  Height:   Ht Readings from Last 1 Encounters:  05/20/20 5' 6"  (1.676 m)    Weight:    Wt Readings from Last 1 Encounters:  05/20/2020 66.4 kg    Ideal Body Weight:  59.1 kg  BMI:  Body mass index is 23.63 kg/m.  Estimated Nutritional Needs:   Kcal:  1650-1850  Protein:  80-90 gm  Fluid:  1.6-1.8 L    [name], RD, LDN, CNSC Please refer to Amion for contact information.

## 2020-05-23 NOTE — Progress Notes (Signed)
Patient came to endo for pleurx. When Dr Vaughan Browner ultrasounded, insufficient fluid on exam.  Tube was unclamped on arrival, clamped here and is to stay clamped until otherwise ordered.

## 2020-05-23 NOTE — Consult Note (Signed)
[facility]  HISTORY AND PHYSICAL  Ashlee Kelly is an 79 y.o. female.    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 Benny Lennert for eval and recs re: progressive CKD in the setting of liver disease.  Complicated course but briefly had thoracentesis day prior to admission with 2L fluid removed.  Had cough and SOB and was noted to have a R PTX.  Chest tube in place.  We are consulted for CKD in the setting of ESLD and some pulm edema.    It appears plan is to go home with hospice.  Pall care is in the room with her as well as son.  Reports some R-sided chest pain for which she is getting pain meds.    Cr has been steadily rising since May of this year.  2.6 03/2020 --> 3.72 05/06/2020 --> 4.07 05/07/2020.  Aldactone has been stopped.  In this setting we are asked to see.    PMH: Past Medical History:  Diagnosis Date  . Arthritis   . Cancer ([place])   . Chronic kidney disease   . Cirrhosis ([place])   . Depression   . Diabetes ([place])   . Heart murmur   . Hepatic encephalopathy ([place])   . Hypothyroidism   . NASH (nonalcoholic steatohepatitis)   . Thrombocytopenia ([place])   . Thyroid disease    PSH: Past Surgical History:  Procedure Laterality Date  . APPENDECTOMY    . BACK SURGERY     x3  . CHOLECYSTECTOMY    . FOOT NEUROMA SURGERY Bilateral   . PARACENTESIS    . PARTIAL HYSTERECTOMY    . THORACENTESIS    . TIPS PROCEDURE    . TONSILLECTOMY       Past Medical History:  Diagnosis Date  . Arthritis   . Cancer ([place])   . Chronic kidney disease   . Cirrhosis ([place])   . Depression   . Diabetes ([place])   . Heart murmur   . Hepatic encephalopathy ([place])   . Hypothyroidism   . NASH (nonalcoholic steatohepatitis)   . Thrombocytopenia ([place])   . Thyroid disease     Medications:   Scheduled: . feeding  supplement (ENSURE ENLIVE)  237 mL Oral BID BM  . insulin aspart  0-15 Units Subcutaneous TID WC  . insulin aspart  0-5 Units Subcutaneous QHS  . insulin glargine  30 Units Subcutaneous QHS  . levothyroxine  150 mcg Oral QAC breakfast  . multivitamin with minerals  1 tablet Oral Daily  . PARoxetine  20 mg Oral Daily  . sodium chloride flush  10 mL Intracatheter Q8H  . sodium chloride flush  10 mL Intracatheter Q8H  . [START ON 05/24/2020] torsemide  20 mg Oral Daily    Medications Prior to Admission  Medication Sig Dispense Refill  . allopurinol (ZYLOPRIM) 100 MG tablet Take 100 mg by mouth daily.    Marland Kitchen amoxicillin-clavulanate (AUGMENTIN) 875-125 MG tablet Take 1 tablet by mouth 2 (two) times daily. 14 tablet 0  . benzonatate (TESSALON) 100 MG capsule Take 1 capsule (100 mg total) by mouth 3 (three) times daily as needed for cough. (Patient taking differently: Take 100 mg by mouth See admin instructions. Take 157m by mouth twice daily in the morning and night. May have additional dose mid-day if needed.) 60 capsule 5  .  ciprofloxacin (CIPRO) 250 MG tablet Take 250 mg by mouth daily with breakfast.    . hydrOXYzine (ATARAX/VISTARIL) 10 MG tablet Take 2 tablets (20 mg total) by mouth at bedtime as needed for itching (insomnia). 60 tablet 2  . LANTUS SOLOSTAR 100 UNIT/ML Solostar Pen Inject 30 Units into the skin at bedtime.     Marland Kitchen levothyroxine (SYNTHROID) 150 MCG tablet Take 150 mcg by mouth daily before breakfast.    . Misc Natural Products (OSTEO BI-FLEX/5-LOXIN ADVANCED) TABS Take 1 tablet by mouth daily.    . Multiple Vitamin (MULTIVITAMIN ADULT PO) Take 1 tablet by mouth every evening.    . ondansetron (ZOFRAN) 4 MG tablet Take 1 tablet (4 mg total) by mouth every 8 (eight) hours as needed for nausea. (Patient taking differently: Take 4 mg by mouth at bedtime as needed for nausea. ) 90 tablet 2  . PARoxetine (PAXIL) 20 MG tablet Take 1 tablet (20 mg total) by mouth daily. (Patient taking  differently: Take 20 mg by mouth at bedtime. ) 30 tablet 5  . torsemide (DEMADEX) 20 MG tablet Take 1.5 tablets (30 mg total) by mouth daily. (Patient taking differently: Take 20 mg by mouth in the morning. ) 45 tablet 1  . blood glucose meter kit and supplies KIT Dispense based on patient and insurance preference. Use to check blood sugar 3 times daily E11.9 1 each 0  . silver sulfADIAZINE (SILVADENE) 1 % cream Apply 1 application topically daily. (Patient not taking: Reported on 05/17/2020) 50 g 0    ALLERGIES:   Allergies  Allergen Reactions  . Celebrex [Celecoxib]     FAM HX: Family History  Problem Relation Age of Onset  . Lung cancer Father     Social History:   reports that she quit smoking about 6 months ago. Her smoking use included cigarettes. She has a 2.50 pack-year smoking history. She has never used smokeless tobacco. She reports previous alcohol use. She reports previous drug use.  ROS: ROS: all other systems reviewed and are negative except as per HPI  Blood pressure (!) 105/42, pulse 98, temperature 97.9 F (36.6 C), temperature source Oral, resp. rate 20, weight 66.4 kg, SpO2 96 %. PHYSICAL EXAM: Physical Exam  GEN: NAD HEENT  EOMI PERRL temporal wasting NECK no JVD PULM pain with inspiration, upper lung fields clear, R clear, L muffled CV RRR ABD soft EXT 2+ upper and lower extremity edema NEURO AAO x 3 but tangential at times SKIN very thin with weeping   Results for orders placed or performed during the hospital encounter of 05/22/2020 (from the past 48 hour(s))  CBC     Status: Abnormal   Collection Time: 05/22/2020  3:00 PM  Result Value Ref Range   WBC 17.8 (H) 4.0 - 10.5 K/uL   RBC 3.39 (L) 3.87 - 5.11 MIL/uL   Hemoglobin 10.8 (L) 12.0 - 15.0 g/dL   HCT 33.5 (L) 36 - 46 %   MCV 98.8 80.0 - 100.0 fL   MCH 31.9 26.0 - 34.0 pg   MCHC 32.2 30.0 - 36.0 g/dL   RDW 18.8 (H) 11.5 - 15.5 %   Platelets 129 (L) 150 - 400 K/uL   nRBC 0.0 0.0 - 0.2 %     Comment: Performed at [facility], [address], [place], [place] [address]  Comprehensive metabolic panel     Status: Abnormal   Collection Time: 05/27/2020  3:00 PM  Result Value Ref Range   Sodium  139 135 - 145 mmol/L   Potassium 3.9 3.5 - 5.1 mmol/L   Chloride 108 98 - 111 mmol/L   CO2 18 (L) 22 - 32 mmol/L   Glucose, Bld 92 70 - 99 mg/dL    Comment: Glucose reference range applies only to samples taken after fasting for at least 8 hours.   BUN 67 (H) 8 - 23 mg/dL   Creatinine, Ser 3.72 (H) 0.44 - 1.00 mg/dL   Calcium 8.3 (L) 8.9 - 10.3 mg/dL   Total Protein 5.1 (L) 6.5 - 8.1 g/dL   Albumin 1.9 (L) 3.5 - 5.0 g/dL   AST 39 15 - 41 U/L   ALT 16 0 - 44 U/L   Alkaline Phosphatase 76 38 - 126 U/L   Total Bilirubin 3.4 (H) 0.3 - 1.2 mg/dL   GFR calc non Af Amer 11 (L) >60 mL/min   GFR calc Af Amer 13 (L) >60 mL/min   Anion gap 13 5 - 15    Comment: Performed at [facility] [address], [place], [place] [address]  Culture, blood (routine x 2)     Status: None (Preliminary result)   Collection Time: 05/17/2020  5:29 PM   Specimen: BLOOD  Result Value Ref Range   Specimen Description BLOOD LEFT ANTECUBITAL    Special Requests      BOTTLES DRAWN AEROBIC AND ANAEROBIC Blood Culture results may not be optimal due to an inadequate volume of blood received in culture bottles   Culture      NO GROWTH < 12 HOURS Performed at [facility] [address], [place], [place] [address]    Report Status PENDING   Magnesium     Status: None   Collection Time: 05/08/2020  5:31 PM  Result Value Ref Range   Magnesium 1.8 1.7 - 2.4 mg/dL    Comment: Performed at [facility], [place] [address] [place], [place] [address]  Phosphorus     Status: None   Collection Time: 05/06/2020  5:31 PM  Result Value Ref Range   Phosphorus 4.6 2.5 - 4.6 mg/dL    Comment: Performed at [facility] [address] [place], [place] [address]  TSH     Status: None   Collection Time:  05/06/2020  5:31 PM  Result Value Ref Range   TSH 0.463 0.350 - 4.500 uIU/mL    Comment: Performed by a 3rd Generation assay with a functional sensitivity of <=0.01 uIU/mL. Performed at [facility], [place] [address], [place], [place] [address]   Protime-INR     Status: Abnormal   Collection Time: 05/03/2020  5:31 PM  Result Value Ref Range   Prothrombin Time 21.1 (H) 11.4 - 15.2 seconds   INR 1.9 (H) 0.8 - 1.2    Comment: (NOTE) INR goal varies based on device and disease states. Performed at [facility], [place] [address] [place], [place] [address]   APTT     Status: None   Collection Time: 05/06/2020  5:31 PM  Result Value Ref Range   aPTT 35 24 - 36 seconds    Comment: Performed at [facility] [address] [place], [place] [address]  Lactic acid, plasma     Status: Abnormal   Collection Time: 05/24/2020  5:31 PM  Result Value Ref Range   Lactic Acid, Venous 4.4 (HH) 0.5 - 1.9 mmol/L    Comment: CRITICAL RESULT CALLED TO, READ BACK  BY AND VERIFIED WITH: J.JOTT RN [id number] 05/24/2020 MCCORMICK K Performed at [facility], [place] [address] [place], [place] [address]   SARS Coronavirus 2 by RT PCR (hospital order, performed in [facility] hospital lab) Nasopharyngeal Nasopharyngeal Swab     Status: None   Collection Time: 05/19/2020  5:39 PM   Specimen: Nasopharyngeal Swab  Result Value Ref Range   SARS Coronavirus 2 NEGATIVE NEGATIVE    Comment: (NOTE) SARS-CoV-2 target nucleic acids are NOT DETECTED.  The SARS-CoV-2 RNA is generally detectable in upper and lower respiratory specimens during the acute phase of infection. The lowest concentration of SARS-CoV-2 viral copies this assay can detect is 250 copies / mL. A negative result does not preclude SARS-CoV-2 infection and should not be used as the sole basis for treatment or other patient management decisions.  A negative result may occur with improper specimen collection / handling, submission of specimen  other than nasopharyngeal swab, presence of viral mutation(s) within the areas targeted by this assay, and inadequate number of viral copies (<250 copies / mL). A negative result must be combined with clinical observations, patient history, and epidemiological information.  Fact Sheet for Patients:   StrictlyIdeas.no  Fact Sheet for Healthcare Providers: BankingDealers.co.za  This test is not yet approved or  cleared by the [place] FDA and has been authorized for detection and/or diagnosis of SARS-CoV-2 by FDA under an Emergency Use Authorization (EUA).  This EUA will remain in effect (meaning this test can be used) for the duration of the COVID-19 declaration under Section 564(b)(1) of the Act, 21 U.S.C. section 360bbb-3(b)(1), unless the authorization is terminated or revoked sooner.  Performed at [facility], [place] [address] [place], [place] [address]   Culture, blood (routine x 2)     Status: None (Preliminary result)   Collection Time: 05/06/2020  6:00 PM   Specimen: BLOOD LEFT ARM  Result Value Ref Range   Specimen Description BLOOD LEFT ARM    Special Requests      BOTTLES DRAWN AEROBIC ONLY Blood Culture results may not be optimal due to an inadequate volume of blood received in culture bottles   Culture      NO GROWTH < 12 HOURS Performed at [facility] [address], [place], [place] [address]    Report Status PENDING   Procalcitonin - Baseline     Status: None   Collection Time: 05/08/2020  7:04 PM  Result Value Ref Range   Procalcitonin 1.24 ng/mL    Comment:        Interpretation: PCT > 0.5 ng/mL and <= 2 ng/mL: Systemic infection (sepsis) is possible, but other conditions are known to elevate PCT as well. (NOTE)       Sepsis PCT Algorithm           Lower Respiratory Tract                                      Infection PCT Algorithm    ----------------------------      ----------------------------         PCT < 0.25 ng/mL                PCT < 0.10 ng/mL          Strongly encourage             Strongly discourage   discontinuation  of antibiotics    initiation of antibiotics    ----------------------------     -----------------------------       PCT 0.25 - 0.50 ng/mL            PCT 0.10 - 0.25 ng/mL               OR       >80% decrease in PCT            Discourage initiation of                                            antibiotics      Encourage discontinuation           of antibiotics    ----------------------------     -----------------------------         PCT >= 0.50 ng/mL              PCT 0.26 - 0.50 ng/mL                AND       <80% decrease in PCT             Encourage initiation of                                             antibiotics       Encourage continuation           of antibiotics    ----------------------------     -----------------------------        PCT >= 0.50 ng/mL                  PCT > 0.50 ng/mL               AND         increase in PCT                  Strongly encourage                                      initiation of antibiotics    Strongly encourage escalation           of antibiotics                                     -----------------------------                                           PCT <= 0.25 ng/mL                                                 OR                                        >  80% decrease in PCT                                      Discontinue / Do not initiate                                             antibiotics  Performed at [facility], [place] [address], [place], [place] [address]   Lactic acid, plasma     Status: Abnormal   Collection Time: 05/18/2020  9:11 PM  Result Value Ref Range   Lactic Acid, Venous 2.1 (HH) 0.5 - 1.9 mmol/L    Comment: CRITICAL VALUE NOTED.  VALUE IS CONSISTENT WITH PREVIOUSLY REPORTED AND CALLED VALUE. Performed at [facility], [place] [address], [place], [place] [address]   CBG monitoring, ED     Status: None   Collection Time: 05/09/2020  9:48 PM  Result Value Ref Range   Glucose-Capillary 91 70 - 99 mg/dL    Comment: Glucose reference range applies only to samples taken after fasting for at least 8 hours.   Comment 1 Document in Chart   Comprehensive metabolic panel     Status: Abnormal   Collection Time: 05/22/20  4:36 AM  Result Value Ref Range   Sodium 140 135 - 145 mmol/L   Potassium 3.9 3.5 - 5.1 mmol/L   Chloride 110 98 - 111 mmol/L   CO2 16 (L) 22 - 32 mmol/L   Glucose, Bld 100 (H) 70 - 99 mg/dL    Comment: Glucose reference range applies only to samples taken after fasting for at least 8 hours.   BUN 73 (H) 8 - 23 mg/dL   Creatinine, Ser 3.81 (H) 0.44 - 1.00 mg/dL   Calcium 8.0 (L) 8.9 - 10.3 mg/dL   Total Protein 4.4 (L) 6.5 - 8.1 g/dL   Albumin 1.5 (L) 3.5 - 5.0 g/dL   AST 33 15 - 41 U/L   ALT 18 0 - 44 U/L   Alkaline Phosphatase 59 38 - 126 U/L   Total Bilirubin 2.0 (H) 0.3 - 1.2 mg/dL   GFR calc non Af Amer 11 (L) >60 mL/min   GFR calc Af Amer 12 (L) >60 mL/min   Anion gap 14 5 - 15    Comment: Performed at [facility] [address], [place], [place] [address]  CBC     Status: Abnormal   Collection Time: 05/22/20  4:36 AM  Result Value Ref Range   WBC 7.1 4.0 - 10.5 K/uL   RBC 2.88 (L) 3.87 - 5.11 MIL/uL   Hemoglobin 9.3 (L) 12.0 - 15.0 g/dL   HCT 30.9 (L) 36 - 46 %   MCV 107.3 (H) 80.0 - 100.0 fL    Comment: REPEATED TO VERIFY QUESTIONABLE RESULTS SUGGEST RECOLLECT TO VERIFY NOTIFIED ANNA AREBOLD RN    MCH 32.3 26.0 - 34.0 pg   MCHC 30.1 30.0 - 36.0 g/dL   RDW 19.1 (H) 11.5 - 15.5 %   Platelets 59 (L) 150 - 400 K/uL    Comment: Immature Platelet Fraction may be clinically indicated, consider ordering this additional test [id number] PLATELET COUNT CONFIRMED BY SMEAR REPEATED TO VERIFY    nRBC 0.3 (H) 0.0 - 0.2 %    Comment: Performed  at [facility], [place] [address],  [place], [place] [address]  CBG monitoring, ED     Status: None   Collection Time: 05/22/20  8:04 AM  Result Value Ref Range   Glucose-Capillary 93 70 - 99 mg/dL    Comment: Glucose reference range applies only to samples taken after fasting for at least 8 hours.   Comment 1 Notify RN    Comment 2 Document in Chart   CBG monitoring, ED     Status: None   Collection Time: 05/22/20 11:57 AM  Result Value Ref Range   Glucose-Capillary 80 70 - 99 mg/dL    Comment: Glucose reference range applies only to samples taken after fasting for at least 8 hours.  Procalcitonin - Baseline     Status: None   Collection Time: 05/22/20  6:49 PM  Result Value Ref Range   Procalcitonin 1.21 ng/mL    Comment:        Interpretation: PCT > 0.5 ng/mL and <= 2 ng/mL: Systemic infection (sepsis) is possible, but other conditions are known to elevate PCT as well. (NOTE)       Sepsis PCT Algorithm           Lower Respiratory Tract                                      Infection PCT Algorithm    ----------------------------     ----------------------------         PCT < 0.25 ng/mL                PCT < 0.10 ng/mL          Strongly encourage             Strongly discourage   discontinuation of antibiotics    initiation of antibiotics    ----------------------------     -----------------------------       PCT 0.25 - 0.50 ng/mL            PCT 0.10 - 0.25 ng/mL               OR       >80% decrease in PCT            Discourage initiation of                                            antibiotics      Encourage discontinuation           of antibiotics    ----------------------------     -----------------------------         PCT >= 0.50 ng/mL              PCT 0.26 - 0.50 ng/mL                AND       <80% decrease in PCT             Encourage initiation of                                             antibiotics       Encourage continuation  of antibiotics    ----------------------------      -----------------------------        PCT >= 0.50 ng/mL                  PCT > 0.50 ng/mL               AND         increase in PCT                  Strongly encourage                                      initiation of antibiotics    Strongly encourage escalation           of antibiotics                                     -----------------------------                                           PCT <= 0.25 ng/mL                                                 OR                                        > 80% decrease in PCT                                      Discontinue / Do not initiate                                             antibiotics  Performed at [facility], [address], [place], [place] [address]   Glucose, capillary     Status: Abnormal   Collection Time: 05/22/20 10:27 PM  Result Value Ref Range   Glucose-Capillary 276 (H) 70 - 99 mg/dL    Comment: Glucose reference range applies only to samples taken after fasting for at least 8 hours.  Glucose, capillary     Status: Abnormal   Collection Time: 04/29/2020 12:29 AM  Result Value Ref Range   Glucose-Capillary 273 (H) 70 - 99 mg/dL    Comment: Glucose reference range applies only to samples taken after fasting for at least 8 hours.  Blood gas, arterial     Status: Abnormal   Collection Time: 05/22/2020  1:02 AM  Result Value Ref Range   FIO2 56.00    pH, Arterial 7.293 (L) 7.35 - 7.45   pCO2 arterial 37.8 32 - 48 mmHg   pO2, Arterial 78.1 (L) 83 - 108 mmHg   Bicarbonate 17.7 (L) 20.0 - 28.0 mmol/L   Acid-base deficit 7.6 (H) 0.0 - 2.0 mmol/L  O2 Saturation 94.7 %   Patient temperature 36.9    Collection site LEFT RADIAL    Drawn by [id number]     Comment: COLLECTED BY RT   Sample type ARTERIAL DRAW    Allens test (pass/fail) PASS PASS    Comment: Performed at [facility], [place] [address], [place], [place] [address]  Comprehensive metabolic panel     Status: Abnormal   Collection Time: 05/12/2020  1:30 AM   Result Value Ref Range   Sodium 135 135 - 145 mmol/L   Potassium 4.1 3.5 - 5.1 mmol/L   Chloride 104 98 - 111 mmol/L   CO2 16 (L) 22 - 32 mmol/L   Glucose, Bld 256 (H) 70 - 99 mg/dL    Comment: Glucose reference range applies only to samples taken after fasting for at least 8 hours.   BUN 80 (H) 8 - 23 mg/dL   Creatinine, Ser 4.07 (H) 0.44 - 1.00 mg/dL   Calcium 7.8 (L) 8.9 - 10.3 mg/dL   Total Protein 4.2 (L) 6.5 - 8.1 g/dL   Albumin 1.5 (L) 3.5 - 5.0 g/dL   AST 40 15 - 41 U/L   ALT 17 0 - 44 U/L   Alkaline Phosphatase 62 38 - 126 U/L   Total Bilirubin 1.9 (H) 0.3 - 1.2 mg/dL   GFR calc non Af Amer 10 (L) >60 mL/min   GFR calc Af Amer 12 (L) >60 mL/min   Anion gap 15 5 - 15    Comment: Performed at [facility] [address], [place], [place] [address]  CBC     Status: Abnormal   Collection Time: 05/12/2020  1:30 AM  Result Value Ref Range   WBC 13.2 (H) 4.0 - 10.5 K/uL   RBC 3.00 (L) 3.87 - 5.11 MIL/uL   Hemoglobin 9.5 (L) 12.0 - 15.0 g/dL   HCT 30.1 (L) 36 - 46 %   MCV 100.3 (H) 80.0 - 100.0 fL   MCH 31.7 26.0 - 34.0 pg   MCHC 31.6 30.0 - 36.0 g/dL   RDW 19.0 (H) 11.5 - 15.5 %   Platelets 96 (L) 150 - 400 K/uL    Comment: Immature Platelet Fraction may be clinically indicated, consider ordering this additional test [id number] REPEATED TO VERIFY    nRBC 0.0 0.0 - 0.2 %    Comment: Performed at [facility], [place] [address], [place], [place] [address]  Ammonia     Status: None   Collection Time: 05/06/2020  1:30 AM  Result Value Ref Range   Ammonia 31 9 - 35 umol/L    Comment: Performed at [facility], [place] [address], [place], [place] [address]  Magnesium     Status: None   Collection Time: 05/09/2020  1:30 AM  Result Value Ref Range   Magnesium 1.9 1.7 - 2.4 mg/dL    Comment: Performed at [facility], [place] [address], [place], [place] [address]  Phosphorus     Status: Abnormal   Collection Time: 05/25/2020  1:30 AM  Result Value Ref Range    Phosphorus 5.8 (H) 2.5 - 4.6 mg/dL    Comment: Performed at [facility] [address], [place], [place] [address]  Glucose, capillary     Status: Abnormal   Collection Time: 05/08/2020  8:17 AM  Result Value Ref Range   Glucose-Capillary 185 (H) 70 - 99 mg/dL    Comment: Glucose reference range applies  only to samples taken after fasting for at least 8 hours.  Glucose, capillary     Status: Abnormal   Collection Time: 05/27/2020 12:19 PM  Result Value Ref Range   Glucose-Capillary 148 (H) 70 - 99 mg/dL    Comment: Glucose reference range applies only to samples taken after fasting for at least 8 hours.    CT HEAD WO CONTRAST  Result Date: 05/17/2020 CLINICAL DATA:  TIA EXAM: CT HEAD WITHOUT CONTRAST TECHNIQUE: Contiguous axial images were obtained from the base of the skull through the vertex without intravenous contrast. COMPARISON:  None FINDINGS: Brain: Some asymmetric hypoattenuation along the pons is likely related to beam hardening across the skull base. No evidence of acute infarction, hemorrhage, hydrocephalus, extra-axial collection or mass lesion/mass effect. Symmetric prominence of the ventricles, cisterns and sulci compatible with parenchymal volume loss. Patchy areas of white matter hypoattenuation are most compatible with chronic microvascular angiopathy. Vascular: Atherosclerotic calcification of the carotid siphons. No hyperdense vessel. Skull: No calvarial fracture or suspicious osseous lesion. No scalp swelling or hematoma. Sinuses/Orbits: Paranasal sinuses and mastoid air cells are predominantly clear. Orbital structures are unremarkable aside from prior lens extractions. Other: None IMPRESSION: 1. No acute intracranial abnormality. If there is persisting concern for acute infarction, MRI is more sensitive and specific for early features of ischemia. 2. Some likely artifactual asymmetric hypoattenuation across the pons though could correlate for clinical symptoms. 3. Mild  parenchymal volume loss and chronic microvascular angiopathy. Electronically Signed   By: Lovena Le M.D.   On: 05/07/2020 04:11   DG Chest Port 1 View  Result Date: 05/12/2020 CLINICAL DATA:  Chest tube EXAM: PORTABLE CHEST 1 VIEW COMPARISON:  Radiograph 05/22/2020 FINDINGS: A right pigtail pleural catheter is partially uncoiled with side port projecting external to the pleural space within the soft tissues of the right lateral chest wall. Suspect a small residual apical right pneumothorax. Persistent small moderate right pleural effusion. No left pneumothorax or effusion. Persistent heterogeneous airspace opacities are present in both lungs with some increasing interstitial opacities bilaterally which could reflect developing edema. Unchanged cardiomediastinal contours with a calcified aorta. Prior cervical fusion. Intrahepatic catheter projecting over the right upper quadrant. Postsurgical changes from prior cholecystectomy. Telemetry leads overlie the chest. No acute osseous or soft tissue abnormality. Degenerative changes are present in the imaged spine and shoulders. IMPRESSION: 1. Right pigtail pleural catheter is partially uncoiled with side ports external to the pleural space, within the soft tissues of the right lateral chest wall. Similar positioning to 1 day prior. 2. Suspect small residual right apical pneumothorax. Persistent small moderate right pleural effusion. 3. Increasing interstitial opacities bilaterally, concerning for developing edema. More heterogeneous airspace opacities elsewhere are unchanged. Electronically Signed   By: Lovena Le M.D.   On: 05/06/2020 06:40   DG Chest Port 1 View  Result Date: 05/22/2020 CLINICAL DATA:  Respiratory distress EXAM: PORTABLE CHEST 1 VIEW COMPARISON:  05/22/2020 FINDINGS: Slightly decreased size of right apical pneumothorax. Unchanged position of right chest tube with side port openings extending outside pleural space. Streaky opacities  throughout the left lung are unchanged. Increased opacity at the right lung base. Normal cardiomediastinal contours. IMPRESSION: 1. Slightly decreased size of right apical pneumothorax. 2. Unchanged position of right chest tube with proximal side holes outside the pleural space. 3. Worsened right basilar opacities.  Unchanged left lung opacities. Electronically Signed   By: Ulyses Jarred M.D.   On: 05/22/2020 23:07   DG CHEST PORT 1 VIEW  Addendum  Date: 05/22/2020   ADDENDUM REPORT: 05/22/2020 12:15 ADDENDUM: The small pneumothorax and retraction of the tube with some of the sideholes within the pleural space and some of the sideholes of the chest tube within the chest wall. These results were called by telephone at the time of interpretation on 05/22/2020 at 12:13 pm to provider Dr. Gevena Barre, Who verbally acknowledged these results. Electronically Signed   By: Zetta Bills M.D.   On: 05/22/2020 12:15   Result Date: 05/22/2020 CLINICAL DATA:  History of pleural effusion with pain a on inspiration. EXAM: PORTABLE CHEST 1 VIEW COMPARISON:  05/10/2020 FINDINGS: RIGHT-sided chest tube appears to been retracted slightly since the previous exam, sideholes for the chest tube now outside the pleural space in the RIGHT chest wall. Small RIGHT apical pneumothorax is now visible measuring approximately a cm at the RIGHT lung apex near complete resolution of RIGHT pleural fluid, diminished since the previous radiograph. Redemonstration of LEFT lower lobe opacity with interstitial and airspace disease at the LEFT lung base. Cardiomediastinal contours and hilar structures are stable. Signs of cervical spinal fusion. Posttraumatic changes in the LEFT proximal humerus as before. IMPRESSION: 1. Small RIGHT apical pneumothorax is now visible measuring approximately a cm at the RIGHT lung apex. 2. RIGHT-sided chest tube has been retracted slightly since the previous exam, sideholes for the pleural space in the RIGHT  chest wall. 3. Stable LEFT lower lobe opacity. 4. A call is out to the referring provider to further discuss findings in the above case. Electronically Signed: By: Zetta Bills M.D. On: 05/22/2020 11:54   DG Chest Port 1 View  Result Date: 05/24/2020 CLINICAL DATA:  Follow-up pneumothorax EXAM: PORTABLE CHEST 1 VIEW COMPARISON:  05/16/2020, 05/20/2020 FINDINGS: Interval insertion of right-sided chest drainage catheter, the pigtail is at the periphery of the right mid lung. There is decreased right pleural effusion with small residual. Right apical pneumothorax is also decreased without definitive pleural line on this exam. Worsening airspace disease in the left lower lung. Stable cardiomediastinal silhouette. IMPRESSION: 1. Interval insertion of right-sided chest drainage catheter with pigtail at the periphery of the right mid lung, there is interval decrease in right pleural effusion. Decreased right apical pneumothorax without definitive pleural line on the current exam. 2. Worsening airspace disease in the left lower lung. Electronically Signed   By: Donavan Foil M.D.   On: 05/12/2020 17:03    Assessment/Plan 1.  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  She is not a dialysis candidate and is going home with hospice. OK to continue torsemide 20 mg daily, would continue to hold aldactone.  Avoid all nephrotoxic agents.  Since GOC focused on comfort, would not try midodrine/ albumin/ octreotide.  2.  Pulm edema on Xray: looks to be comfortable on her O2.  Would defer to palliative care for orders re: air hunger if that occurs.  I wouldn't increase torsemide standing dose as this will likely contribute to increased BUN/Cr and potentially worsen encephalopathic symptoms.  3.   Skin weeping- could do single dose prn of 10 mg for skin weeping if son thinks that's making her uncomfortable  4.  Dispo: to hospice.  Nothing further to add.  Will sign off.  Call with questions.   Madelon Lips 05/22/2020, 2:24 PM

## 2020-05-23 NOTE — Progress Notes (Signed)
PROGRESS NOTE  Tane Biegler EVO:[id number] DOB: 1941/08/27 DOA: 05/02/2020 PCP: Binnie Rail, MD  Brief History   79 year old female past medical history of hypothyroidism, Karlene Lineman cirrhosis, recurrent right hepatic hydrothorax, stage IV chronic kidney disease and diabetes mellitus presented to emergency room on 6/23 with complaints of shortness of breath.  She had had a thoracentesis the day prior at her pulmonologist office where they removed 2 L of fluid.  At that time, bilateral infiltrates noted on chest x-ray and she was started on Augmentin.  Patient tolerated procedure well, but then that evening she had shortness of breath which progressed and so she came into the emergency room.  Patient had TIPS procedure done on 3/15 and also found to have a DVT in the right upper extremity from a PICC line, but no anticoagulation was done due to high risk of bleeding from liver disease.  In the emergency room, patient found to be hypoxic requiring oxygen, 4 L.  Emergency room physician placed a chest tube.  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  Patient was admitted to the hospitalist service.  Started on Rocephin and Zithromax for infiltrates noted on x-ray.  Critical care followed up today and have clamped chest tube.  Plan will be for Pleurx catheter tomorrow.  Palliative care consulted who will see patient and help transition to hospice.  Patient and son aware of plan.  The patient was evaluated for Pleurex today, however, not enough fluid has re-accumulated to do Pleurex. Chest tube was left in place and clamped.   Palliative care was consulted.  Consultants  . Palliative Care .  PCCM . Nephrology  Procedures  . CT placement  Antibiotics   Anti-infectives (From admission, onward)   Start     Dose/Rate  Route Frequency Ordered Stop   05/19/2020 2200  amoxicillin-clavulanate (AUGMENTIN) 875-125 MG per tablet 1 tablet  Status:  Discontinued        1 tablet Oral 2 times daily 05/01/2020 1701 05/10/2020 1723   05/28/2020 2200  amoxicillin-clavulanate (AUGMENTIN) 500-125 MG per tablet 500 mg  Status:  Discontinued        1 tablet Oral Every 12 hours 05/16/2020 1723 05/25/2020 1729   05/17/2020 2000  cefTRIAXone (ROCEPHIN) 1 g in sodium chloride 0.9 % 100 mL IVPB     Discontinue     1 g 200 mL/hr over 30 Minutes Intravenous Every 24 hours 05/13/2020 1901     05/05/2020 2000  azithromycin (ZITHROMAX) 500 mg in sodium chloride 0.9 % 250 mL IVPB     Discontinue     500 mg 250 mL/hr over 60 Minutes Intravenous Every 24 hours 05/04/2020 1901      .   Subjective  The patient is resting comfortably. No new complaints.  Objective   Vitals:  Vitals:   05/09/2020 1646 04/30/2020 1654  BP:  108/69  Pulse: (!) 107 (!) 105  Resp: 20   Temp: 98.3 F (36.8 C)   SpO2: 98% 99%   Exam:  Constitutional:  . The patient is awake, alert, and oriented x 3. No acute distress. Respiratory:  . No increased work of breathing. . No wheezes, rales, or rhonchi . No tactile fremitus . Diminished breath sounds at bases. Cardiovascular:  . Regular rate and rhythm . No murmurs, ectopy, or gallups. . No lateral  PMI. No thrills. Abdomen:  . Abdomen is soft, non-tender, non-distended . No hernias, masses, or organomegaly . Normoactive bowel sounds.  Musculoskeletal:  . No cyanosis, clubbing, or edema Skin:  . No rashes, lesions, ulcers . palpation of skin: no induration or nodules Neurologic:  . CN 2-12 intact . Sensation all 4 extremities intact Psychiatric:  . Mental status o Mood, affect appropriate o Orientation to person, place, time  . judgment and insight appear intact  I have personally reviewed the following:   Today's Data  . Vitals, Glucoses, CMP, ABG  Imaging  . CT Brain  Scheduled Meds: . feeding  supplement (ENSURE ENLIVE)  237 mL Oral BID BM  . insulin aspart  0-15 Units Subcutaneous TID WC  . insulin aspart  0-5 Units Subcutaneous QHS  . insulin glargine  30 Units Subcutaneous QHS  . levothyroxine  150 mcg Oral QAC breakfast  . multivitamin with minerals  1 tablet Oral Daily  . PARoxetine  20 mg Oral Daily  . sodium chloride flush  10 mL Intracatheter Q8H  . sodium chloride flush  10 mL Intracatheter Q8H  . [START ON 05/24/2020] torsemide  20 mg Oral Daily   Continuous Infusions: . azithromycin 500 mg (05/22/20 2252)  . cefTRIAXone (ROCEPHIN)  IV 1 g (05/22/20 2057)    Principal Problem:   Acute hypoxemic respiratory failure (HCC) Active Problems:   CKD (chronic kidney disease) stage 4, GFR 15-29 ml/min (HCC)   DVT of axillary vein, chronic right (HCC)   Hypothyroidism   Cirrhosis of liver with ascites (HCC)   Diabetes (HCC)   Pleural effusion on right, recurrent due to hepatic hydrothorax   Aortic valve stenosis, mild-mod   Thrombocytopenia (HCC)   Portal hypertension (HCC)   NASH (nonalcoholic steatohepatitis)   AKI (acute kidney injury) ([place])   Pneumothorax   Chest tube in place   Palliative care by specialist   Goals of care, counseling/discussion   LOS: 2 days   A & P  Mental status changes: Likely due to electrolyte abnormalities, acidosis, hepatic encephalopathy. CT head did not demonstrate any acute pathology.  Acute on chronic kidney disease: Worsening and contributing to metabolic acidosis. In part this is due to removal of fluid from right lung and hepatorenal syndrome. I appreciate nephrology's assistance. Pt will continue to receive torsemide 20 mg daily. Aldactone will be held.   Acute on chronic respiratory failure: The patient will have pleurex catheter placed when able. She is currently saturating 93% on room air.   Recent upper extremity DVT: No AC due to liver disease and high risk of bleeding.  Hypothyroidism: Continue Synthroid as at  home.  DM II: The patient's glucoses are being managed with Lantus 30 units and SSI.   Aortic valve stenosis: mild- moderate. Noted. Stable.  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 Pt is appropriate for hospice.  Protein calorie malnutrition: Noted. Poor appetite. Nutrition has been consulted.  Presumed community-acquired pneumonia: The patient is receiving rocephin and zithromax.  I have seen and examined this patient myself. I have spent 38 minutes in her evaluation and care.  DVT Prophylaxis: SCD's CODE STATUS: DNR Family Communication: None available Disposition: From home. Anticipate discharge to home or to residential hospice facility. Barriers to discharge: Placement of Pleurex catheter. Pt is to continue to receive acute care and treatment until she is discharged without escalation of care.  Status is: Inpatient  Remains inpatient appropriate because:Inpatient level of care appropriate due  to severity of illness   Dispo: The patient is from: Home              Anticipated d/c is to: Home with hospice vs residential hospice              Anticipated d/c date is: 2 days              Patient currently is not medically stable to d/c.   Keena Heesch, DO [facility] Direct contact: see [email or web address]  7PM-7AM contact night coverage as above 05/16/2020, 7:33 PM  LOS: 2 days

## 2020-05-23 NOTE — Progress Notes (Signed)
   05/28/2020 2034  Assess: MEWS Score  BP 90/60  Pulse Rate (!) 110  ECG Heart Rate (!) 111  SpO2 93 %  Assess: MEWS Score  MEWS Temp 0  MEWS Systolic 1  MEWS Pulse 2  MEWS RR 0  MEWS LOC 0  MEWS Score 3  MEWS Score Color Yellow  Assess: if the MEWS score is Yellow or Red  Were vital signs taken at a resting state? Yes  Focused Assessment Documented focused assessment  Early Detection of Sepsis Score *See Row Information* Low  MEWS guidelines implemented *See Row Information* Yes  Treat  MEWS Interventions Escalated (See documentation below)  Take Vital Signs  Increase Vital Sign Frequency  Yellow: Q 2hr X 2 then Q 4hr X 2, if remains yellow, continue Q 4hrs  Escalate  MEWS: Escalate Yellow: discuss with charge nurse/RN and consider discussing with provider and RRT  Notify: Charge Nurse/RN  Name of Charge Nurse/RN Notified Shanell, RN  Date Charge Nurse/RN Notified 05/17/2020  Time Charge Nurse/RN Notified 2044  Notify: Provider  Provider Name/Title S. Marcello Moores  Date Provider Notified 05/04/2020  Time Provider Notified 2035  Notification Type Page  Notification Reason Change in status  Response No new orders  Date of Provider Response 05/18/2020  Time of Provider Response 2048  Notify: Rapid Response  Name of Rapid Response RN Notified [place]  Date Rapid Response Notified 05/28/2020  Time Rapid Response Notified 2040  Document  Patient Outcome Other (Comment)  Progress note created (see row info) Yes

## 2020-05-23 NOTE — Consult Note (Signed)
Consultation Note Date: 05/02/2020   Patient Name: Ashlee Kelly  DOB: October 28, 1941  MRN: [id number]  Age / Sex: 79 y.o., female  PCP: Binnie Rail, MD Referring Physician: Karie Kirks, DO  Reason for Consultation: Establishing goals of care  HPI/Patient Profile: 79 y.o. female  with past medical history of NASH cirrhosis, recurrent right hydrothorax, hypothyroidism, CKD, DM, arthritis admitted on 05/28/2020 with shortness of breath. Underwent outpatient thoracentesis with primary pulmonologist Dr. Vaughan Browner 6/22, 2L removed. Post procedure, patient with shortness of breath and cough. CXR revealed small right pneumothorax and stable in office. Discharged home. Patient with worsening shortness of breath and hypoxia requiring ED visit and admission. Chest tube placed in ED. PCCM consulted. 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 PCCM and TRH recommending palliative measures including pleurx for symptom management and home hospice services. Palliative medicine consultation for goals of care.   Clinical Assessment and Goals of Care:  I have reviewed medical records, discussed with care team, and met with patient and son Ashlee Kelly) at bedside to discuss goals of care. Ashlee Kelly is awake, alert, oriented and able to participate in conversation. She is intermittently confused and forgetful. She admits to hallucinating last night, saw a mouse running down the wall. Ashlee Kelly reports that she has experienced hospital delirium on previous admissions.   During visit, patient does complain of right-sided chest discomfort, worse with deep breathing. RN at bedside and gave prn oxycodone which patient reports relief of pain when given yesterday. She is currently on 7L [place]. Tolerating ice chips.   I introduced Palliative Medicine as specialized medical care for  people living with serious illness. It focuses on providing relief from the symptoms and stress of a serious illness. The goal is to improve quality of life for both the patient and the family.  We discussed a brief life review of the patient. Widowed. Ashlee Kelly is only child. Patient also has two supportive sisters and a brother. Patient was diagnosed with cirrhosis approximately 7 years ago. Ashlee Kelly reports worsening health decline since March 2021 when she was hospitalized with fluid and pneumonia. She has required frequent thoracentesis. Patient shares that her family gives her joy.   Discussed in detail events leading up to admission and course of hospitalization including diagnoses, interventions, plan of care. Frankly and compassionately discussed poor long-term prognosis with end-stage liver disease with recurrent fluid and now worsening kidney function, likely cardiorenal syndrome. Educated on medical recommendation for comfort focused care and initiation of hospice on discharge.   Although patient is intermittently confused, she does seem to have a good understanding that she may only live weeks or months. She is not fearful. She wishes to spend time with her family. Ashlee Kelly also understands diagnoses and prognosis.   The difference between aggressive medical intervention and comfort care was considered in light of the patient's goals of care.  Introduced hospice philosophy and options. Prior to admission, patient living home with sister. Ashlee Kelly spoke with his Aunt  yesterday and she is wanting and willing to bring Ashlee Kelly home with support of hospice services. Discussed DME needs. Discussed symptom management medications and use of medications to ensure comfort, relief from suffering, and prevent recurrent admission. Plan is for pleurx cath placement today to assist with symptoms. Answered all questions regarding hospice. Patient and son understand and agree with initiation of home hospice services on  discharge.   Patient has documented living will and Ashlee Kelly is her POA. Patient confirms her decision for DNR code status and does not wish to be a burden on her son. Son understands and respects decision for DNR. Durable DNR completed and placed in chart.  Appreciate Dr. Hollie Salk and her nephrology recommendations during [place] discussion.   Questions and concerns were addressed.  Hard Choices booklet and PMT contact information given.     SUMMARY OF RECOMMENDATIONS    DNR/DNI. No escalation of care. No dialysis.   Continue current plan of care inpatient with discharge plan for transition to comfort focused care and home hospice services.   Pending pleurx cath placement today.   TOC team notified to arrange home hospice services. Will need DME.   Symptom management--see below.   Code Status/Advance Care Planning:  DNR  Symptom Management:   Pending pleurx cath placement today  Oxycodone 66m PO q4h prn pain/dyspnea  Hydroxyzine 281mPO HS prn itching/anxiety/insomnia  PRN IV dilaudid breakthrough pain  Palliative Prophylaxis:   Aspiration, Bowel Regimen, Delirium Protocol, Frequent Pain Assessment, Oral Care and Turn Reposition   Psycho-social/Spiritual:   Desire for further Chaplaincy support: yes  Additional Recommendations: Caregiving  Support/Resources, Compassionate Wean Education and Education on Hospice  Prognosis:   Poor long-term prognosis months if not weeks with end-stage NASH cirrhosis, recurrent right hepatic hydrothorax, now worsening renal function likely hepatorenal syndrome  Discharge Planning: Home with Hospice      Primary Diagnoses: Present on Admission: . Thrombocytopenia ([facility] [place]. Pleural effusion on right, recurrent due to hepatic hydrothorax . NASH (nonalcoholic steatohepatitis) . Hypothyroidism . Acute hypoxemic respiratory failure ([facility]. AKI (acute kidney injury) (HCEthan. Pneumothorax . CKD (chronic kidney disease) stage 4, GFR 15-29  ml/min (HCC) . Cirrhosis of liver with ascites ([facility]. Portal hypertension ([facility]. DVT of axillary vein, chronic right ([facility]  I have reviewed the medical record, interviewed the patient and family, and examined the patient. The following aspects are pertinent.  Past Medical History:  Diagnosis Date  . Arthritis   . Cancer ([facility]  . Chronic kidney disease   . Cirrhosis ([facility]  . Depression   . Diabetes ([facility]  . Heart murmur   . Hepatic encephalopathy (HCP[facility]  . Hypothyroidism   . NASH (nonalcoholic steatohepatitis)   . Thrombocytopenia (HCSuper[facility]  . Thyroid disease    Social History   Socioeconomic History  . Marital status: Widowed    Spouse name: Not on file  . Number of children: Not on file  . Years of education: Not on file  . Highest education level: Not on file  Occupational History  . Not on file  Tobacco Use  . Smoking status: Former Smoker    Packs/day: 0.25    Years: 10.00    Pack years: 2.50    Types: Cigarettes    Quit date: 10/2019    Years since quitting: 0.5  . Smokeless tobacco: Never Used  . Tobacco comment: Pt. unsure about number of years smoked. ER  Substance and Sexual Activity  . Alcohol  use: Not Currently  . Drug use: Not Currently  . Sexual activity: Not on file  Other Topics Concern  . Not on file  Social History Narrative  . Not on file   Social Determinants of Health   Financial Resource Strain:   . Difficulty of Paying Living Expenses:   Food Insecurity:   . Worried About Charity fundraiser in the Last Year:   . Arboriculturist in the Last Year:   Transportation Needs:   . Film/video editor (Medical):   Marland Kitchen Lack of Transportation (Non-Medical):   Physical Activity:   . Days of Exercise per Week:   . Minutes of Exercise per Session:   Stress:   . Feeling of Stress :   Social Connections:   . Frequency of Communication with Friends and Family:   . Frequency of Social Gatherings with Friends and Family:   . Attends  Religious Services:   . Active Member of Clubs or Organizations:   . Attends Archivist Meetings:   Marland Kitchen Marital Status:    Family History  Problem Relation Age of Onset  . Lung cancer Father    Scheduled Meds: . [MAR Hold] feeding supplement (ENSURE ENLIVE)  237 mL Oral BID BM  . [MAR Hold] insulin aspart  0-15 Units Subcutaneous TID WC  . [MAR Hold] insulin aspart  0-5 Units Subcutaneous QHS  . [MAR Hold] insulin glargine  30 Units Subcutaneous QHS  . [MAR Hold] levothyroxine  150 mcg Oral QAC breakfast  . [MAR Hold] multivitamin with minerals  1 tablet Oral Daily  . [MAR Hold] PARoxetine  20 mg Oral Daily  . [MAR Hold] sodium chloride flush  10 mL Intracatheter Q8H  . [MAR Hold] sodium chloride flush  10 mL Intracatheter Q8H  . [MAR Hold] torsemide  20 mg Oral Daily   Continuous Infusions: . [MAR Hold] azithromycin 500 mg (05/22/20 2252)  . [MAR Hold] cefTRIAXone (ROCEPHIN)  IV 1 g (05/22/20 2057)   PRN Meds:.[MAR Hold] acetaminophen **OR** [MAR Hold] acetaminophen, [MAR Hold]  HYDROmorphone (DILAUDID) injection, [MAR Hold] hydrOXYzine, [MAR Hold] ondansetron **OR** [MAR Hold] ondansetron (ZOFRAN) IV, [MAR Hold] oxyCODONE Medications Prior to Admission:  Prior to Admission medications   Medication Sig Start Date End Date Taking? Authorizing Provider  allopurinol (ZYLOPRIM) 100 MG tablet Take 100 mg by mouth daily.   Yes [provider]  amoxicillin-clavulanate (AUGMENTIN) 875-125 MG tablet Take 1 tablet by mouth 2 (two) times daily. 05/20/20  Yes Mannam, Praveen, MD  benzonatate (TESSALON) 100 MG capsule Take 1 capsule (100 mg total) by mouth 3 (three) times daily as needed for cough. Patient taking differently: Take 100 mg by mouth See admin instructions. Take 162m by mouth twice daily in the morning and night. May have additional dose mid-day if needed. 04/16/20  Yes Burns, SClaudina Lick MD  ciprofloxacin (CIPRO) 250 MG tablet Take 250 mg by mouth daily with breakfast.    Yes [provider]  hydrOXYzine (ATARAX/VISTARIL) 10 MG tablet Take 2 tablets (20 mg total) by mouth at bedtime as needed for itching (insomnia). 04/16/20  Yes Burns, SClaudina Lick MD  LANTUS SOLOSTAR 100 UNIT/ML Solostar Pen Inject 30 Units into the skin at bedtime.  04/01/20  Yes [provider]  levothyroxine (SYNTHROID) 150 MCG tablet Take 150 mcg by mouth daily before breakfast.   Yes [provider]  Misc Natural Products (OSTEO BI-FLEX/5-LOXIN ADVANCED) TABS Take 1 tablet by mouth daily.   Yes [provider]  Multiple Vitamin (MULTIVITAMIN ADULT PO) Take 1 tablet by mouth every evening.   Yes [provider]  ondansetron (ZOFRAN) 4 MG tablet Take 1 tablet (4 mg total) by mouth every 8 (eight) hours as needed for nausea. Patient taking differently: Take 4 mg by mouth at bedtime as needed for nausea.  03/20/20  Yes Carlis Stable, NP  PARoxetine (PAXIL) 20 MG tablet Take 1 tablet (20 mg total) by mouth daily. Patient taking differently: Take 20 mg by mouth at bedtime.  03/19/20  Yes Burns, Claudina Lick, MD  torsemide (DEMADEX) 20 MG tablet Take 1.5 tablets (30 mg total) by mouth daily. Patient taking differently: Take 20 mg by mouth in the morning.  04/01/20  Yes Mannam, Praveen, MD  blood glucose meter kit and supplies KIT Dispense based on patient and insurance preference. Use to check blood sugar 3 times daily E11.9 04/08/20   Binnie Rail, MD  silver sulfADIAZINE (SILVADENE) 1 % cream Apply 1 application topically daily. Patient not taking: Reported on 05/02/2020 04/16/20   Binnie Rail, MD   Allergies  Allergen Reactions  . Celebrex [Celecoxib]    Review of Systems  Neurological: Positive for weakness.   Physical Exam Vitals and nursing note reviewed.  Constitutional:      General: She is awake.     Appearance: She is cachectic. She is ill-appearing.  HENT:     Head: Normocephalic and atraumatic.  Cardiovascular:     Rate and Rhythm: Normal  rate.  Pulmonary:     Effort: No tachypnea, accessory muscle usage or respiratory distress.     Comments: 7L [place] Abdominal:     Tenderness: There is no abdominal tenderness.  Skin:    General: Skin is warm and dry.     Coloration: Skin is pale.     Findings: Ecchymosis present.     Comments: anasarca  Neurological:     Mental Status: She is alert and oriented to person, place, and time.     Comments: Intermittent confusion/forgetfulness. Able to engage in conversation.    Vital Signs: BP (!) 105/42 (BP Location: Left Arm)   Pulse 98   Temp 97.9 F (36.6 C) (Oral)   Resp 20   Wt 66.4 kg   SpO2 96%   BMI 23.63 kg/m  Pain Scale: 0-10   Pain Score: 5    SpO2: SpO2: 96 % O2 Device:SpO2: 96 % O2 Flow Rate: .O2 Flow Rate (L/min): 8 L/min  IO: Intake/output summary:   Intake/Output Summary (Last 24 hours) at 05/10/2020 1544 Last data filed at 05/11/2020 6067 Gross per 24 hour  Intake 360 ml  Output 580 ml  Net -220 ml    LBM: Last BM Date: 05/22/2020 Baseline Weight: Weight: 66.4 kg Most recent weight: Weight: 66.4 kg     Palliative Assessment/Data: PPS 40%   Flowsheet Rows     Most Recent Value  Intake Tab  Referral Department Hospitalist  Unit at Time of Referral Cardiac/Telemetry Unit  Palliative Care Primary Diagnosis Other (Comment)  [facility] cirrhosis]  Palliative Care Type New Palliative care  Reason for referral Clarify Goals of Care  Date first seen by Palliative Care 05/16/2020  Clinical Assessment  Palliative Performance Scale Score 40%  Psychosocial & Spiritual Assessment  Palliative Care Outcomes  Patient/Family meeting held? Yes  Who was at the meeting? patient, son  Palliative Care Outcomes Clarified goals of care, Improved pain interventions, Improved non-pain symptom therapy, Counseled regarding hospice, Provided end of  life care assistance, Provided psychosocial or spiritual support, Provided advance care planning, Completed durable DNR, Transitioned  to hospice, ACP counseling assistance     Time In: 1210 Time Out: 1340  Time Total: 90 min Greater than 50%  of this time was spent counseling and coordinating care related to the above assessment and plan.  Signed by:  Ihor Dow, DNP, FNP-C Palliative Medicine Team  Phone: [phone] Fax: [phone]   Please contact Palliative Medicine Team phone at [phone] for questions and concerns.  For individual provider: See Shea Evans

## 2020-05-23 NOTE — Progress Notes (Signed)
Called by rn for lower bp  90/60  Manual . Patient is asx and per rn patient  has had decrease uop. Urinated 2 twice per documentation, however this was not measured. Will give one time dose of midodrine .  If per persistent will give dose of albumin.  RN to further update MD re patient progress.  Of note current plans per palliative care note is for no further escalation of care. Plans for transition to comfort care on discharge.

## 2020-05-23 NOTE — Progress Notes (Signed)
   05/11/2020 0024  Assess: MEWS Score  Temp 98.4 F (36.9 C)  BP (!) 93/49  Pulse Rate (!) 104  ECG Heart Rate (!) 104  Resp 20  SpO2 92 %  O2 Device HFNC  O2 Flow Rate (L/min) 10 L/min  Assess: MEWS Score  MEWS Temp 0  MEWS Systolic 1  MEWS Pulse 1  MEWS RR 0  MEWS LOC 0  MEWS Score 2  MEWS Score Color Yellow  Assess: if the MEWS score is Yellow or Red  Were vital signs taken at a resting state? Yes  Focused Assessment Documented focused assessment  Early Detection of Sepsis Score *See Row Information* Medium  MEWS guidelines implemented *See Row Information* Yes  Treat  MEWS Interventions Escalated (See documentation below)  Take Vital Signs  Increase Vital Sign Frequency  Yellow: Q 2hr X 2 then Q 4hr X 2, if remains yellow, continue Q 4hrs  Escalate  MEWS: Escalate Yellow: discuss with charge nurse/RN and consider discussing with provider and RRT  Notify: Charge Nurse/RN  Name of Charge Nurse/RN Notified Shanell RN  Date Charge Nurse/RN Notified 05/22/20  Time Charge Nurse/RN Notified 2000  Notify: Provider  Provider Name/Title Gilman Schmidt  Date Provider Notified 05/22/20  Time Provider Notified 2330  Notification Type Page  Notification Reason Change in status  Response See new orders;Other (Comment) (Came to bedside)  Date of Provider Response 05/22/20  Time of Provider Response 2340  Notify: Rapid Response  Name of Rapid Response RN Notified Mindy H   Date Rapid Response Notified 05/22/20  Time Rapid Response Notified 2130  Document  Patient Outcome Stabilized after interventions

## 2020-05-23 NOTE — Progress Notes (Addendum)
NAME:  Ashlee Kelly, MRN:  [id number], DOB:  Aug 14, 1941, LOS: 2 ADMISSION DATE:  05/22/2020, CONSULTATION DATE:  05/02/2020 REFERRING MD:  Dr. 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 Underwent outpatient thoracentesis with primary pulmonologist Dr. Vaughan Browner 6/22 seen with hypoxia and pneumothorax 6/23 resulting in pigtail chest tube insertions by ED provider and admission to [facility] service. PCCM consulted for chest tube management   History of present illness   Ashlee Kelly is a 79 year old female with a past medical history significant for Karlene Lineman, thyroid disease, thrombocytopenia, hypothyroidism, diabetes, cirrhosis, arthritis, and chronic kidney disease stage IV who presented to the emergency department today for hypoxia and pneumothorax.    On 6/23 patient presented to Dr. Vaughan Browner, primary pulmonologist for an urgent visit due to worsening dyspnea in office work-up revealed recurrent right hepatic hydropneumothorax in the setting of [place] cirrhosis.  Thoracentesis was performed in office with 2 L of straw-colored fluid removed.  Post procedure patient experienced pain with inhalation, shortness of breath and cough resulting in stat chest x-ray.  Chest x-ray in office revealed a tiny right pneumothorax, patient was observed in clinic several hours with stable repeat chest x-ray therefore patient was discharged home.   6/24 as instructed patient presented for repeat chest x-ray to evaluate pneumothorax which revealed mildly increased size of small right apical pneumothorax and increased moderate right pleural effusion.  This in combination with hypoxia with SPO2 sat of 70% resulted in placement of pigtail chest tube in ED by ED provider. On further evaluation it appears that the patient was experiencing a pneumothorax ex vacuo post thoracentesis.   Patient tolerated procedure well and was  admitted under hospitalist services.  PCCM consulted for chest tube management.   Past Medical History  [place] Thrombocytopenia Hypothyroidism Diabetes Cancer Chronic kidney disease stage 4  Significant Hospital Events   Admitted 6/23, pigtail chest tube placed in ED on admisison   Consults:  PCCM    Procedures:  Pigtail chest tube placement by EDP 6/23  Significant Diagnostic Tests:  CXR 6/23 > 1. Mildly increased size of small right apical pneumothorax. 2. Increased moderate right pleural effusion. 3. Bilateral airspace disease with mild progression on the right. CXR 6/26 IMPRESSION: 1. Right pigtail pleural catheter is partially uncoiled with side ports external to the pleural space, within the soft tissues of the right lateral chest wall. Similar positioning to 1 day prior. 2. Suspect small residual right apical pneumothorax. Persistent small moderate right pleural effusion. 3. Increasing interstitial opacities bilaterally, concerning for developing edema. More heterogeneous airspace opacities elsewhere are unchanged.  Micro Data:    Antimicrobials:    623 Zithromax 623 8 Rocephin Interim history/subjective:  In obvious discomfort Objective   Blood pressure (!) 105/42, pulse 98, temperature 97.9 F (36.6 C), temperature source Oral, resp. rate 20, weight 66.4 kg, SpO2 96 %.        Intake/Output Summary (Last 24 hours) at 04/29/2020 0949 Last data filed at 05/02/2020 8937 Gross per 24 hour  Intake 360 ml  Output 580 ml  Net -220 ml   Filed Weights   05/03/2020 0430  Weight: 66.4 kg    Examination: 78 year old who is in obvious discomfort complaining of chest pain No JVD or lymphadenopathy is appreciated Decreased breath sounds throughout Right chest tube without air leak scant drainage Abdomen obese soft nontender Extremities warm   Resolved Hospital Problem list  Assessment & Plan:  Pneumothorax ex vacuo post thoracentesis.  Acute on  chronic hypoxic respiratory distress -6/23 patient underwent in office thoracentesis with 2L removed, on 6/23 it appears that the patient developed a pneumothorax ex vacuo post thoracentesis.  CAP -Bilateral infiltrates seen on CXR  P:  Continue chest tube, note chest tube will need to discontinued or new one placed as current chest tube partially out Scant amount of serous drainage noted in the chest tube without accumulation Intermittent flushing Questionable Pleurx catheter insertion is as a palliative intervention Continue antibiotics Consider palliative care consult     Best practice:  Diet: Cardiac diet Pain/Anxiety/Delirium protocol (if indicated): PRNs VAP protocol (if indicated): No applicable  DVT prophylaxis: Sub heparin GI prophylaxis: PPI Glucose control: SSI Mobility: Up with assistance  Code Status: Full Family Communication: 05/28/2020 patient updated at bedside Disposition: Floor  Labs   CBC: Recent Labs  Lab 05/16/2020 1500 05/22/20 0436 05/17/2020 0130  WBC 17.8* 7.1 13.2*  HGB 10.8* 9.3* 9.5*  HCT 33.5* 30.9* 30.1*  MCV 98.8 107.3* 100.3*  PLT 129* 59* 96*    Basic Metabolic Panel: Recent Labs  Lab 05/09/2020 1500 05/10/2020 1731 05/22/20 0436 05/20/2020 0130  NA 139  --  140 135  K 3.9  --  3.9 4.1  CL 108  --  110 104  CO2 18*  --  16* 16*  GLUCOSE 92  --  100* 256*  BUN 67*  --  73* 80*  CREATININE 3.72*  --  3.81* 4.07*  CALCIUM 8.3*  --  8.0* 7.8*  MG  --  1.8  --  1.9  PHOS  --  4.6  --  5.8*   GFR: Estimated Creatinine Clearance: 10.8 mL/min (A) (by C-G formula based on SCr of 4.07 mg/dL (H)). Recent Labs  Lab 05/08/2020 1500 05/20/2020 1731 05/01/2020 1904 05/17/2020 2111 05/22/20 0436 05/22/20 1849 05/01/2020 0130  PROCALCITON  --   --  1.24  --   --  1.21  --   WBC 17.8*  --   --   --  7.1  --  13.2*  LATICACIDVEN  --  4.4*  --  2.1*  --   --   --     Liver Function Tests: Recent Labs  Lab 05/05/2020 1500 05/22/20 0436  05/11/2020 0130  AST 39 33 40  ALT 16 18 17   ALKPHOS 76 59 62  BILITOT 3.4* 2.0* 1.9*  PROT 5.1* 4.4* 4.2*  ALBUMIN 1.9* 1.5* 1.5*   No results for input(s): LIPASE, AMYLASE in the last 168 hours. Recent Labs  Lab 05/13/2020 0130  AMMONIA 31    ABG    Component Value Date/Time   PHART 7.293 (L) 05/02/2020 0102   PCO2ART 37.8 04/30/2020 0102   PO2ART 78.1 (L) 05/19/2020 0102   HCO3 17.7 (L) 05/07/2020 0102   ACIDBASEDEF 7.6 (H) 05/20/2020 0102   O2SAT 94.7 05/12/2020 0102     Coagulation Profile: Recent Labs  Lab 05/05/2020 1731  INR 1.9*    Cardiac Enzymes: No results for input(s): CKTOTAL, CKMB, CKMBINDEX, TROPONINI in the last 168 hours.  HbA1C: Hgb A1c MFr Bld  Date/Time Value Ref Range Status  04/16/2020 03:40 PM 5.6 4.6 - 6.5 % Final    Comment:    Glycemic Control Guidelines for People with Diabetes:Non Diabetic:  <6%Goal of Therapy: <7%Additional Action Suggested:  >8%     CBG: Recent Labs  Lab 05/22/20 0804 05/22/20 1157 05/22/20 2227 05/01/2020 0029 05/18/2020 2595  GLUCAP 93 80 276* 273* 185*     Signature:    Richardson Landry Xareni Kelch ACNP Acute Care Nurse Practitioner [place] Please consult Amion 05/13/2020, 9:49 AM

## 2020-05-23 NOTE — Progress Notes (Signed)
Called to see patient due to change in ms   Patient is a 79 y/o female that was admitted to hospital 05/11/2020, due to acute progressive sob. 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 Patient diagnosed with pnx s/p outpatient thoracentesis. Patient was admitted to ed and had chest tube placed for hydro pnemothorax.  Patient this evening had increase sob/ progressive hypoxemia which necessitated transition from 2L  to 9L HFNC per chart.  Patient s/p un clamping of chest tube with drainage of 280 cc had improvement in symptoms and HFNC was able to titrated down. Patient cxr noted decrease in side of right pleural effusion as well as pnx.  However patient course further complicated by change in mental status.  Per RN patient was alert and oriented x 4 at the beginning of her shift and now only oriented x 1.   Initial focused assessment:  ON interview patient is alert to self only GEN: NAD, no increase wob/acessary muscle use, awake CV: s1s2 tachy, +m Lungs: anterior cta no rhonchi/wheezes Neuro: awake, oriented x 1, follows commands. PERRLA , EOMI, CN grossly intact, 4+ strength in all ext except left upper extremity that has + drift. No asterixis on exam  A Change in ms At this time  Baseline neuro /strenght exam unknown Most likely encephalopathy related to hypoxemia/infection/possible hepatic encephalopathy However acute organic path cannot be definitively ruled out with > of left arm drift.  Hypoxemia: needing increase of HFNC  From 5L to 9L Of note recent cxr shows improved findings.  Plan  Abg/ammonia/bmp Ct non contrast once patient is stable for exam  To evaluate metabolic encephalopathy vs acute organic brain path as etiology  Neuro checks q4h   RN to alert MD for further patient needs

## 2020-05-24 ENCOUNTER — Inpatient Hospital Stay (HOSPITAL_COMMUNITY): Payer: Medicare Other

## 2020-05-24 LAB — CBC WITH DIFFERENTIAL/PLATELET
Abs Immature Granulocytes: 0.08 10*3/uL — ABNORMAL HIGH (ref 0.00–0.07)
Basophils Absolute: 0 10*3/uL (ref 0.0–0.1)
Basophils Relative: 0 %
Eosinophils Absolute: 0.1 10*3/uL (ref 0.0–0.5)
Eosinophils Relative: 0 %
HCT: 28.7 % — ABNORMAL LOW (ref 36.0–46.0)
Hemoglobin: 9.7 g/dL — ABNORMAL LOW (ref 12.0–15.0)
Immature Granulocytes: 1 %
Lymphocytes Relative: 9 %
Lymphs Abs: 1 10*3/uL (ref 0.7–4.0)
MCH: 32.7 pg (ref 26.0–34.0)
MCHC: 33.8 g/dL (ref 30.0–36.0)
MCV: 96.6 fL (ref 80.0–100.0)
Monocytes Absolute: 0.7 10*3/uL (ref 0.1–1.0)
Monocytes Relative: 6 %
Neutro Abs: 10.2 10*3/uL — ABNORMAL HIGH (ref 1.7–7.7)
Neutrophils Relative %: 84 %
Platelets: 68 10*3/uL — ABNORMAL LOW (ref 150–400)
RBC: 2.97 MIL/uL — ABNORMAL LOW (ref 3.87–5.11)
RDW: 18.5 % — ABNORMAL HIGH (ref 11.5–15.5)
WBC: 12.1 10*3/uL — ABNORMAL HIGH (ref 4.0–10.5)
nRBC: 0 % (ref 0.0–0.2)

## 2020-05-24 LAB — COMPREHENSIVE METABOLIC PANEL
ALT: 16 U/L (ref 0–44)
AST: 33 U/L (ref 15–41)
Albumin: 1.3 g/dL — ABNORMAL LOW (ref 3.5–5.0)
Alkaline Phosphatase: 54 U/L (ref 38–126)
Anion gap: 13 (ref 5–15)
BUN: 92 mg/dL — ABNORMAL HIGH (ref 8–23)
CO2: 16 mmol/L — ABNORMAL LOW (ref 22–32)
Calcium: 8 mg/dL — ABNORMAL LOW (ref 8.9–10.3)
Chloride: 105 mmol/L (ref 98–111)
Creatinine, Ser: 4.8 mg/dL — ABNORMAL HIGH (ref 0.44–1.00)
GFR calc Af Amer: 9 mL/min — ABNORMAL LOW (ref >60)
GFR calc non Af Amer: 8 mL/min — ABNORMAL LOW (ref >60)
Glucose, Bld: 227 mg/dL — ABNORMAL HIGH (ref 70–99)
Potassium: 4.9 mmol/L (ref 3.5–5.1)
Sodium: 134 mmol/L — ABNORMAL LOW (ref 135–145)
Total Bilirubin: 1.4 mg/dL — ABNORMAL HIGH (ref 0.3–1.2)
Total Protein: 4 g/dL — ABNORMAL LOW (ref 6.5–8.1)

## 2020-05-24 LAB — GLUCOSE, CAPILLARY: Glucose-Capillary: 177 mg/dL — ABNORMAL HIGH (ref 70–99)

## 2020-05-24 MED ORDER — LORAZEPAM 2 MG/ML PO CONC: 0.6000 mg | Freq: Three times a day (TID) | ORAL | 0 refills | Status: AC

## 2020-05-24 MED ORDER — MORPHINE SULFATE (CONCENTRATE) 20 MG/ML PO SOLN: 5.0000 mg | ORAL | 0 refills | Status: AC | PRN

## 2020-05-24 MED ORDER — ENSURE ENLIVE PO LIQD: 237.0000 mL | Freq: Two times a day (BID) | ORAL | 12 refills | Status: AC

## 2020-05-24 MED ORDER — ACETAMINOPHEN 325 MG PO TABS: 650.0000 mg | ORAL_TABLET | Freq: Four times a day (QID) | ORAL | 0 refills | Status: AC | PRN

## 2020-05-24 MED ORDER — ONDANSETRON HCL 4 MG PO TABS: 4.0000 mg | ORAL_TABLET | Freq: Four times a day (QID) | ORAL | 0 refills | Status: AC | PRN

## 2020-05-24 MED ORDER — MORPHINE SULFATE (PF) 2 MG/ML IV SOLN
1.0000 mg | INTRAVENOUS | Status: DC | PRN
  Administered 2020-05-24 – 2020-05-26 (×4): 2 mg via INTRAVENOUS
  Filled 2020-05-24 (×5): qty 1

## 2020-05-24 MED ORDER — TORSEMIDE 20 MG PO TABS: 20.0000 mg | ORAL_TABLET | Freq: Every morning | ORAL | Status: AC

## 2020-05-24 MED ORDER — OXYCODONE HCL 10 MG PO TABS: 10.0000 mg | ORAL_TABLET | Freq: Four times a day (QID) | ORAL | 0 refills | Status: AC | PRN

## 2020-05-24 MED ORDER — OXYCODONE HCL 5 MG PO TABS: 10.0000 mg | ORAL_TABLET | Freq: Four times a day (QID) | ORAL | Status: DC | PRN

## 2020-05-24 NOTE — Procedures (Signed)
PleurX Insertion Procedure Note  Aki Burdin  [id number]  June 16, 1941  Date:05/24/20  Time:2:19 PM   Provider Performing:Grettell Ransdell Loletha Grayer Tamala Julian  Procedure: PleurX Tunneled Pleural Catheter Placement (84665)  Indication(s) Relief of dyspnea from recurrent right effusion  Consent Risks of the procedure as well as the alternatives and risks of each were explained to the patient and/or caregiver.  Consent for the procedure was obtained.   Anesthesia Topical only with 1% lidocaine    Time Out Verified patient identification, verified procedure, site/side was marked, verified correct patient position, special equipment/implants available, medications/allergies/relevant history reviewed, required imaging and test results available.   Sterile Technique Maximal sterile technique including sterile barrier drape, hand hygiene, sterile gown, sterile gloves, mask, hair covering.    Procedure Description Ultrasound used to identify appropriate pleural anatomy for placement and overlying skin marked.  Area of drainage cleaned and draped in sterile fashion.   Lidocaine was used to anesthetize the skin and subcutaneous tissue.   1.5 cm incision made overlying fluid and another about 5 cm anterior to this along chest wall.  PleurX catheter inserted in usual sterile fashion using modified seldinger technique.  Interrupted silk sutures placed at catheter insertion and tunneling points which will be removed at later date.  PleurX catheter then hooked to suction.  After fluid aspirated, pleurX capped and sterile dressing applied.   Complications/Tolerance None; patient tolerated the procedure well. Chest X-ray is ordered to confirm no post-procedural complication.   EBL Minimal   Specimen(s) none

## 2020-05-24 NOTE — Progress Notes (Addendum)
Patients pleurx cathter attached to [place] drainage system / water seal.  Will follow up CXR in am and likely discontinue water seal if improved hydropneumothorax.     Patient education provided to son regarding how to drain pleurx catheter and dressing changes.    Noe Gens, MSN, NP-C [facility] 05/24/2020, 5:04 PM   Please see [email or web address] for pager details.

## 2020-05-24 NOTE — Progress Notes (Addendum)
Received referral to assist pt with home hospice. Pt resides in [place], [place]. [place] for referral. Spoke to Horris Latino, RN [phone] and she accepted the referral. Discussed DME (hospital bed and O2) and pleurx catheter. She reports that they can set up everything by tomorrow. She reports that they use [facility] and she asked that the MD fax the prescriptions before she is D/C. Will fax clinical [phone]) [phone]. Received confirmation that the fax was successful. Dr. Benny Lennert and Ihor Dow, NP notified of above. Will continue to f/u to assist with the D/C plan.

## 2020-05-24 NOTE — Progress Notes (Signed)
PROGRESS NOTE  Ashlee Kelly SFK:[id number] DOB: [birth year] DOA: 05/12/2020 PCP: Binnie Rail, MD  Brief History   79 year old female past medical history of hypothyroidism, Karlene Lineman cirrhosis, recurrent right hepatic hydrothorax, stage IV chronic kidney disease and diabetes mellitus presented to emergency room on 6/23 with complaints of shortness of breath.  She had had a thoracentesis the day prior at her pulmonologist office where they removed 2 L of fluid.  At that time, bilateral infiltrates noted on chest x-ray and she was started on Augmentin.  Patient tolerated procedure well, but then that evening she had shortness of breath which progressed and so she came into the emergency room.  Patient had TIPS procedure done on 3/15 and also found to have a DVT in the right upper extremity from a PICC line, but no anticoagulation was done due to high risk of bleeding from liver disease.  In the emergency room, patient found to be hypoxic requiring oxygen, 4 L.  Emergency room physician placed a chest tube.  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  Patient was admitted to the hospitalist service.  Started on Rocephin and Zithromax for infiltrates noted on x-ray.  Critical care followed up today and have clamped chest tube.  Plan will be for Pleurx catheter tomorrow.  Palliative care consulted who will see patient and help transition to hospice.  Patient and son aware of plan.  The patient was evaluated for Pleurex today, however, not enough fluid has re-accumulated to do Pleurex. Chest tube was left in place and clamped.   Palliative care was consulted.  Consultants   Palliative Care   PCCM  Nephrology  Procedures   CT placement  Antibiotics   Anti-infectives (From admission, onward)   Start     Dose/Rate  Route Frequency Ordered Stop   05/25/2020 2200  amoxicillin-clavulanate (AUGMENTIN) 875-125 MG per tablet 1 tablet  Status:  Discontinued        1 tablet Oral 2 times daily 05/17/2020 1701 05/20/2020 1723   05/07/2020 2200  amoxicillin-clavulanate (AUGMENTIN) 500-125 MG per tablet 500 mg  Status:  Discontinued        1 tablet Oral Every 12 hours 04/29/2020 1723 05/12/2020 1729   05/06/2020 2000  cefTRIAXone (ROCEPHIN) 1 g in sodium chloride 0.9 % 100 mL IVPB  Status:  Discontinued        1 g 200 mL/hr over 30 Minutes Intravenous Every 24 hours 05/28/2020 1901 05/24/20 0902   05/16/2020 2000  azithromycin (ZITHROMAX) 500 mg in sodium chloride 0.9 % 250 mL IVPB  Status:  Discontinued        500 mg 250 mL/hr over 60 Minutes Intravenous Every 24 hours 05/18/2020 1901 05/24/20 0902      Subjective  The patient is resting comfortably. No new complaints.  Objective   Vitals:  Vitals:   05/24/20 1420 05/24/20 1430  BP: (!) 88/46 (!) 116/52  Pulse: (!) 109 (!) 109  Resp: 20 17  Temp:    SpO2: 92% 91%   Exam:  Constitutional:   The patient is awake, alert, and oriented x 3. No acute distress. Respiratory:   No increased work of breathing.  No wheezes, rales, or rhonchi  No tactile fremitus  Diminished breath sounds at bases. Cardiovascular:   Regular rate and rhythm  No murmurs, ectopy, or gallups.  No lateral PMI. No thrills. Abdomen:   Abdomen is soft, non-tender, non-distended  No hernias, masses, or organomegaly  Normoactive bowel sounds.  Musculoskeletal:   No cyanosis, clubbing, or edema Skin:   No rashes, lesions, ulcers  palpation of skin: no induration or nodules Neurologic:   CN 2-12 intact  Sensation all 4 extremities intact Psychiatric:   Mental status o Mood, affect appropriate o Orientation to person, place, time   judgment and insight appear intact  I have personally reviewed the following:   Today's Data   Vitals, Glucoses, CMP, ABG  Imaging   CT  Brain  Scheduled Meds:  feeding supplement (ENSURE ENLIVE)  237 mL Oral BID BM   PARoxetine  20 mg Oral Daily   sodium chloride flush  10 mL Intracatheter Q8H   sodium chloride flush  10 mL Intracatheter Q8H   torsemide  20 mg Oral Daily   Continuous Infusions:   Principal Problem:   Acute hypoxemic respiratory failure (HCC) Active Problems:   CKD (chronic kidney disease) stage 4, GFR 15-29 ml/min (HCC)   DVT of axillary vein, chronic right (HCC)   Hypothyroidism   Cirrhosis of liver with ascites (HCC)   Diabetes (HCC)   Pleural effusion   Aortic valve stenosis, mild-mod   Thrombocytopenia (HCC)   Portal hypertension (HCC)   NASH (nonalcoholic steatohepatitis)   AKI (acute kidney injury) ([place])   Pneumothorax   Chest tube in place   Palliative care by specialist   Goals of care, counseling/discussion   LOS: 3 days   A & P  Mental status changes: Likely due to electrolyte abnormalities, acidosis, hepatic encephalopathy. CT head did not demonstrate any acute pathology.  Acute on chronic kidney disease: Worsening and contributing to metabolic acidosis. In part this is due to removal of fluid from right lung and hepatorenal syndrome. I appreciate nephrology's assistance. Pt will continue to receive torsemide 20 mg daily. Aldactone will be held.   Acute on chronic respiratory failure: The patient will have pleurex catheter placed when able. The family wishes for this to be doen prior to discharge. She is currently saturating 93% on room air.   Recent upper extremity DVT: No AC due to liver disease and high risk of bleeding.  Hypothyroidism: Continue Synthroid as at home.  DM II: The patient's glucoses are being managed with Lantus 30 units and SSI.   Aortic valve stenosis: mild- moderate. Noted. Stable.  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 Pt is appropriate for hospice.  Protein calorie malnutrition: Noted. Poor  appetite. Nutrition has been consulted.  Presumed community-acquired pneumonia: The patient is receiving rocephin and zithromax.  I have seen and examined this patient myself. I have spent 38 minutes in her evaluation and care.  DVT Prophylaxis: SCD's CODE STATUS: DNR Family Communication: None available Disposition: From home. Anticipate discharge to home or to residential hospice facility. Barriers to discharge: Placement of Pleurex catheter. Pt is to continue to receive acute care and treatment until she is discharged without escalation of care.  Status is: Inpatient  Remains inpatient appropriate because:Inpatient level of care appropriate due to severity of illness   Dispo: The patient is from: Home              Anticipated d/c is to: Home with hospice vs residential hospice              Anticipated d/c date is: 2 days  Patient currently is not medically stable to d/c.   Sonita Michiels, DO [facility] Direct contact: see [email or web address]  7PM-7AM contact night coverage as above 05/24/2020, 5:44 PM  LOS: 2 days

## 2020-05-24 NOTE — Progress Notes (Signed)
Daily Progress Note   Patient Name: Ashlee Kelly       Date: 05/24/2020 DOB: October 21, 1941  Age: 79 y.o. MRN#: [id number] Attending Physician: Karie Kirks, DO Primary Care Physician: Binnie Rail, MD Admit Date: 05/05/2020  Reason for Consultation/Follow-up: Establishing goals of care  Subjective: Patient awake, alert, oriented and able to participate in conversation. She does have intermittent confusion and forgetfulness but does seem to grasp severity of her condition. She complains of chest discomfort, worse with deep breaths. She reports a bad night, especially when nursing staff had to replace IV.   GOC:  No family at bedside this morning. Spoke with son, Laverna Peace via telephone to provide update from night shift with worsening blood pressure, urine output and worsening kidney function this morning. Frankly and compassionately explained to Laverna Peace that I believe his mother's time is short, possibly weeks with liver and kidney failure (A few days ago, Laverna Peace was told three months). I prepared him that time is shorter than he was recently told and that if the family wanted to pursue residential hospice, this could be an option too. Laverna Peace will further discuss with his aunt, Steward Drone and we plan to meet again this afternoon.   Education on recommendation for comfort measures in the hospital so we can better manage his mother's pain and not focus as much on numbers. Jimmy states "I want her forever but I don't want her to hurt." Laverna Peace agrees with transition to comfort measures inpatient in order to manage his mother's pain and symptoms so she does not suffer. Updated RN and Dr. Benny Lennert.   Shortly after, f/u with patient, son Laverna Peace) and DIL Langley Gauss) at bedside to discuss plan. Again explained update from night  shift and poor prognosis likely weeks with end-stage liver cirrhosis and now kidney failure. After further thought and discussion about hospice options, patient and family do decide to still pursue home with hospice services. Family can care for her at home with support from hospice. Discussed symptom management medications. Patient and family would still like pleurx placed before discharge for symptom management. Discussed comfort care in the hospital and explained that interventions not aimed at comfort have been discontinued. Patient and family agree with this plan. While at bedside, patient still complaining of pain following prn oxycodone. RN to give prn morphine.  Answered questions and concerns. PMT contact information given.  Discussed with RN, Dr. Benny Lennert, and RN CM to arrange home hospice referral and DME needs.   Length of Stay: 3  Current Medications: Scheduled Meds:  . feeding supplement (ENSURE ENLIVE)  237 mL Oral BID BM  . PARoxetine  20 mg Oral Daily  . sodium chloride flush  10 mL Intracatheter Q8H  . sodium chloride flush  10 mL Intracatheter Q8H  . torsemide  20 mg Oral Daily    Continuous Infusions:   PRN Meds: acetaminophen **OR** acetaminophen, hydrOXYzine, morphine injection, ondansetron **OR** ondansetron (ZOFRAN) IV, oxyCODONE  Physical Exam Vitals and nursing note reviewed.  Constitutional:      General: She is awake.     Appearance: She is ill-appearing.  HENT:     Head: Normocephalic and atraumatic.  Cardiovascular:     Rate and Rhythm: Normal rate.  Pulmonary:     Effort: No tachypnea, accessory muscle usage or respiratory distress.     Comments: 7L [place] Neurological:     Mental Status: She is alert and oriented to person, place, and time.     Comments: Intermittently confused/forgetful but able to engage in conversation.            Vital Signs: BP (!) 116/52 (BP Location: Right Leg)   Pulse (!) 109   Temp 98.1 F (36.7 C) (Axillary)   Resp 17    Wt 65.6 kg   SpO2 91%   BMI 23.34 kg/m  SpO2: SpO2: 91 % O2 Device: O2 Device: Nasal Cannula O2 Flow Rate: O2 Flow Rate (L/min): 7 L/min  Intake/output summary:   Intake/Output Summary (Last 24 hours) at 05/24/2020 1545 Last data filed at 05/28/2020 1850 Gross per 24 hour  Intake --  Output 350 ml  Net -350 ml   LBM: Last BM Date: 05/22/2020 Baseline Weight: Weight: 66.4 kg Most recent weight: Weight: 65.6 kg       Palliative Assessment/Data: PPS 30%    Flowsheet Rows     Most Recent Value  Intake Tab  Referral Department Hospitalist  Unit at Time of Referral Cardiac/Telemetry Unit  Palliative Care Primary Diagnosis Other (Comment)  [facility] cirrhosis]  Palliative Care Type New Palliative care  Reason for referral Clarify Goals of Care  Date first seen by Palliative Care 05/19/2020  Clinical Assessment  Palliative Performance Scale Score 30%  Psychosocial & Spiritual Assessment  Palliative Care Outcomes  Patient/Family meeting held? Yes  Who was at the meeting? patient, son  Palliative Care Outcomes Clarified goals of care, Counseled regarding hospice, Provided end of life care assistance, Provided psychosocial or spiritual support, Improved non-pain symptom therapy, Improved pain interventions, ACP counseling assistance, Transitioned to hospice      Patient Active Problem List   Diagnosis Date Noted  . Chest tube in place   . Palliative care by specialist   . Goals of care, counseling/discussion   . NASH (nonalcoholic steatohepatitis)   . Acute hypoxemic respiratory failure ([place])   . AKI (acute kidney injury) ([place])   . Pneumothorax   . Insomnia 04/16/2020  . Cough 04/16/2020  . Portal hypertension ([place]) 03/20/2020  . History of gout 03/18/2020  . Anxiety and depression 03/18/2020  . SOB (shortness of breath) 03/18/2020  . Mitral regurgitation, mild-mod 03/18/2020  . Aortic valve stenosis, mild-mod 03/18/2020  . 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 CKD (chronic kidney disease) stage 4, GFR 15-29 ml/min (HCC) 03/11/2020  . DVT of axillary  vein, chronic right ([place]) 03/11/2020  . Memory deficit 03/11/2020  . Anemia 03/11/2020  . Acute deep vein thrombosis (DVT) of axillary vein of right upper extremity ([place]) 02/15/2020  . Chronic bilateral low back pain 01/30/2020  . Coagulopathy ([place]) 01/30/2020  . History of tobacco abuse 01/30/2020  . Hypothyroidism 01/30/2020  . IBS (irritable bowel syndrome) 01/30/2020  . Cirrhosis of liver with ascites ([place]) 01/30/2020  . Diabetes ([place]) 01/30/2020  . 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 Thrombocytopenia ([place]) 01/30/2020    Palliative Care Assessment & Plan   Patient Profile: 79 y.o. female  with past medical history of NASH cirrhosis, recurrent right hydrothorax, hypothyroidism, CKD, DM, arthritis admitted on 05/14/2020 with shortness of breath. Underwent outpatient thoracentesis with primary pulmonologist Dr. Vaughan Browner 6/22, 2L removed. Post procedure, patient with shortness of breath and cough. CXR revealed small right pneumothorax and stable in office. Discharged home. Patient with worsening shortness of breath and hypoxia requiring ED visit and admission. Chest tube placed in ED. PCCM consulted. 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 PCCM and TRH recommending palliative measures including pleurx for symptom management and home hospice services. Palliative medicine consultation for goals of care.   Assessment: NASH cirrhosis, end-stage AKI on CKD, likely hepatorenal Recurrent right hydrothorax Acute on chronic respiratory failure CAP Aortic valve stenosis Recent RUE DVT  Recommendations/Plan:  Transition to comfort measures inpatient. DNR/DNI, no escalation of care. Discontinued interventions not aimed at comfort.   Continue prn symptom  management medications on MAR.   Pending pleurx cath placement for symptom management.  Comfort feeds per patient/family request.  TOC team following and arranging home with hospice services. Will need DME.   Unrestricted visitor access for EOL care.   Patient/family prepared and understand poor prognosis of likely weeks.   Code Status: DNR/DNI   Code Status Orders  (From admission, onward)         Start     Ordered   05/08/2020 1719  Do not attempt resuscitation (DNR)  Continuous       Question Answer Comment  In the event of cardiac or respiratory ARREST Do not call a "code blue"   In the event of cardiac or respiratory ARREST Do not perform Intubation, CPR, defibrillation or ACLS   In the event of cardiac or respiratory ARREST Use medication by any route, position, wound care, and other measures to relive pain and suffering. May use oxygen, suction and manual treatment of airway obstruction as needed for comfort.      05/14/2020 1718        Code Status History    Date Active Date Inactive Code Status Order ID Comments User Context   05/04/2020 1658 05/15/2020 1718 Full Code [id number]  Mckinley Jewel, MD ED   Advance Care Planning Activity      Prognosis:   Poor prognosis likely weeks  Discharge Planning:  Home with Hospice  Care plan was discussed with patient, son, DIL, RN, Dr. Benny Lennert, RN CM  Thank you for allowing the Palliative Medicine Team to assist in the care of this patient.   Time In: 0830- 1115- Time Out: 0900 1155 Total Time 70 Prolonged yes      Greater than 50%  of this time was spent counseling and coordinating care related to the above assessment and plan.  Ihor Dow, DNP, FNP-C Palliative Medicine Team  Phone: [phone] Fax: [phone]  Please contact Palliative Medicine Team phone at [phone] for questions and concerns.

## 2020-05-25 ENCOUNTER — Inpatient Hospital Stay (HOSPITAL_COMMUNITY): Payer: Medicare Other

## 2020-05-25 MED ORDER — LORAZEPAM 2 MG/ML IJ SOLN
0.5000 mg | INTRAMUSCULAR | Status: DC | PRN
  Administered 2020-05-25 – 2020-05-26 (×5): 0.5 mg via INTRAVENOUS
  Filled 2020-05-25 (×5): qty 1

## 2020-05-25 MED ORDER — DIPHENHYDRAMINE HCL 50 MG/ML IJ SOLN
25.0000 mg | Freq: Four times a day (QID) | INTRAMUSCULAR | Status: DC | PRN
  Administered 2020-05-25 – 2020-05-26 (×2): 25 mg via INTRAVENOUS
  Filled 2020-05-25 (×2): qty 1

## 2020-05-25 MED ORDER — FENTANYL CITRATE (PF) 100 MCG/2ML IJ SOLN
12.5000 ug | INTRAMUSCULAR | Status: DC | PRN
  Administered 2020-05-25 – 2020-05-26 (×8): 12.5 ug via INTRAVENOUS
  Filled 2020-05-25 (×8): qty 2

## 2020-05-25 NOTE — Progress Notes (Signed)
PROGRESS NOTE  Subrena Devereux QQP:[id number] DOB: 1941/03/24 DOA: 05/11/2020 PCP: Binnie Rail, MD  Brief History   79 year old female past medical history of hypothyroidism, Karlene Lineman cirrhosis, recurrent right hepatic hydrothorax, stage IV chronic kidney disease and diabetes mellitus presented to emergency room on 6/23 with complaints of shortness of breath.  She had had a thoracentesis the day prior at her pulmonologist office where they removed 2 L of fluid.  At that time, bilateral infiltrates noted on chest x-ray and she was started on Augmentin.  Patient tolerated procedure well, but then that evening she had shortness of breath which progressed and so she came into the emergency room.  Patient had TIPS procedure done on 3/15 and also found to have a DVT in the right upper extremity from a PICC line, but no anticoagulation was done due to high risk of bleeding from liver disease.  In the emergency room, patient found to be hypoxic requiring oxygen, 4 L.  Emergency room physician placed a chest tube.  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  Patient was admitted to the hospitalist service.  Started on Rocephin and Zithromax for infiltrates noted on x-ray.  Critical care followed up today and have clamped chest tube.  Plan will be for Pleurx catheter tomorrow.  Palliative care consulted who will see patient and help transition to hospice.  Patient and son aware of plan.  The patient was evaluated for Pleurex today, however, not enough fluid has re-accumulated to do Pleurex. Chest tube was left in place and clamped.   Palliative care was consulted, but the family wished to go ahead with Pleurex before the patient was discharged to home on comfort care.  I received a message from nursing that the patient was lethargic  this am, not following commands and unable to take her oral medications.  The patient appears to be actively dying. Family made aware.  Consultants   Palliative Care   PCCM  Nephrology  Procedures   CT placement  Antibiotics   Anti-infectives (From admission, onward)   Start     Dose/Rate Route Frequency Ordered Stop   05/13/2020 2200  amoxicillin-clavulanate (AUGMENTIN) 875-125 MG per tablet 1 tablet  Status:  Discontinued        1 tablet Oral 2 times daily 05/28/2020 1701 05/13/2020 1723   04/30/2020 2200  amoxicillin-clavulanate (AUGMENTIN) 500-125 MG per tablet 500 mg  Status:  Discontinued        1 tablet Oral Every 12 hours 04/30/2020 1723 05/18/2020 1729   04/30/2020 2000  cefTRIAXone (ROCEPHIN) 1 g in sodium chloride 0.9 % 100 mL IVPB  Status:  Discontinued        1 g 200 mL/hr over 30 Minutes Intravenous Every 24 hours 05/22/2020 1901 05/24/20 0902   05/25/2020 2000  azithromycin (ZITHROMAX) 500 mg in sodium chloride 0.9 % 250 mL IVPB  Status:  Discontinued        500 mg 250 mL/hr over 60 Minutes Intravenous Every 24 hours 05/24/2020 1901 05/24/20 0902      Subjective  The patient is rousable, but altered. Her speech is not intelligible. She is a little agitated.  Objective   Vitals:  Vitals:   05/25/20 0413 05/25/20 1721  BP: (!) 87/32 (!) 57/20  Pulse: 97 (!) 102  Resp: 14 18  Temp: 98.2 F (  36.8 C) 98.5 F (36.9 C)  SpO2: 100% 100%   Exam:  Constitutional:   The patient is lethargic, confused, a little agitated. Respiratory:   No increased work of breathing.  No wheezes, rales, or rhonchi  No tactile fremitus  Diminished breath sounds at bases. Cardiovascular:   Regular rate and rhythm  No murmurs, ectopy, or gallups.  No lateral PMI. No thrills. Abdomen:   Abdomen is soft, non-tender, non-distended  No hernias, masses, or organomegaly  Normoactive bowel sounds.  Musculoskeletal:   No cyanosis, clubbing, or edema Skin:   No rashes, lesions,  ulcers  palpation of skin: no induration or nodules Neurologic:  Unable to evaluate due to inability to cooperate with exam. Psychiatric:   Unable to evaluate due to inability to cooperate with exam.  I have personally reviewed the following:   Today's Data   Vitals  Imaging   CT Brain  Scheduled Meds:  PARoxetine  20 mg Oral Daily   sodium chloride flush  10 mL Intracatheter Q8H   sodium chloride flush  10 mL Intracatheter Q8H   torsemide  20 mg Oral Daily   Continuous Infusions:   Principal Problem:   Acute hypoxemic respiratory failure (HCC) Active Problems:   CKD (chronic kidney disease) stage 4, GFR 15-29 ml/min (HCC)   DVT of axillary vein, chronic right (HCC)   Hypothyroidism   Cirrhosis of liver with ascites (HCC)   Diabetes (HCC)   Pleural effusion   Aortic valve stenosis, mild-mod   Thrombocytopenia (HCC)   Portal hypertension (HCC)   NASH (nonalcoholic steatohepatitis)   AKI (acute kidney injury) ([place])   Pneumothorax   Chest tube in place   Palliative care by specialist   Goals of care, counseling/discussion   LOS: 4 days   A & P  Mental status changes: The patietn appears to be actively dying. Family is aware. The patient has been placed on full comfort care. I do not believe she will make it to Pleurex catheter placement.  Acute on chronic kidney disease: Worsening and contributing to metabolic acidosis. In part this is due to removal of fluid from right lung and hepatorenal syndrome. I appreciate nephrology's assistance. Comfort care only.  Acute on chronic respiratory failure: The patient will likely not survive to have pleurex catheter placed. She is comfort care only. .   Recent upper extremity DVT: Comfort care only.  Hypothyroidism: Comfort care only.  DM II: The patient's glucoses are being managed with Lantus 30 units and SSI. Comfot care only.  Aortic valve stenosis: mild- moderate. Noted. Stable. Comfort care only.  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 Pt is appropriate for hospice. Comfort care only.  Protein calorie malnutrition: Noted. Poor appetite. Nutrition has been consulted.Comfort care only.  Presumed community-acquired pneumonia: The patient is receiving rocephin and zithromax. She is now comfort care only.  I have seen and examined this patient myself. I have spent 48 minutes in her evaluation and care.  DVT Prophylaxis: SCD's CODE STATUS: DNR Family Communication: None available Disposition: From home. Anticipate in hospital demise vs discharge to residential hospice. Barriers to discharge: Pt is actively dying.   Status is: Inpatient  Remains inpatient appropriate because:Inpatient level of care appropriate due to severity of illness   Dispo: The patient is from: Home              Anticipated d/c is to: Home with hospice vs residential hospice  Anticipated d/c date is: 1 day              Patient currently is not medically stable to d/c.   Shamon Lobo, DO [facility] Direct contact: see [email or web address]  7PM-7AM contact night coverage as above 05/25/2020, 6:25 PM  LOS: 2 days

## 2020-05-25 NOTE — Progress Notes (Signed)
NAME:  Ashlee Kelly, MRN:  [id number], DOB:  [birth year], LOS: 4 ADMISSION DATE:  05/06/2020, CONSULTATION DATE:  05/02/2020 REFERRING MD:  Dr. 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 Underwent outpatient thoracentesis with primary pulmonologist Dr. Vaughan Browner 6/22 seen with hypoxia and pneumothorax 6/23 resulting in pigtail chest tube insertions by ED provider and admission to [facility] service. PCCM consulted for chest tube management   History of present illness   Ashlee Kelly is a 79 year old female with a past medical history significant for Ashlee Kelly, thyroid disease, thrombocytopenia, hypothyroidism, diabetes, cirrhosis, arthritis, and chronic kidney disease stage IV who presented to the emergency department today for hypoxia and pneumothorax.    On 6/23 patient presented to Dr. Vaughan Browner, primary pulmonologist for an urgent visit due to worsening dyspnea in office work-up revealed recurrent right hepatic hydropneumothorax in the setting of [place] cirrhosis.  Thoracentesis was performed in office with 2 L of straw-colored fluid removed.  Post procedure patient experienced pain with inhalation, shortness of breath and cough resulting in stat chest x-ray.  Chest x-ray in office revealed a tiny right pneumothorax, patient was observed in clinic several hours with stable repeat chest x-ray therefore patient was discharged home.   6/24 as instructed patient presented for repeat chest x-ray to evaluate pneumothorax which revealed mildly increased size of small right apical pneumothorax and increased moderate right pleural effusion.  This in combination with hypoxia with SPO2 sat of 70% resulted in placement of pigtail chest tube in ED by ED provider. On further evaluation it appears that the patient was experiencing a pneumothorax ex vacuo post thoracentesis.   Patient tolerated procedure well and was  admitted under hospitalist services.  PCCM consulted for chest tube management.   Past Medical History  [place] Thrombocytopenia Hypothyroidism Diabetes Cancer Chronic kidney disease stage 4  Significant Hospital Events   Admitted 6/23, pigtail chest tube placed in ED on admisison   Consults:  PCCM    Procedures:  Pigtail chest tube placement by EDP 6/23 pleurX R on 6/26  Significant Diagnostic Tests:  CXR 6/23 > 1. Mildly increased size of small right apical pneumothorax. 2. Increased moderate right pleural effusion. 3. Bilateral airspace disease with mild progression on the right. CXR 6/26 IMPRESSION: 1. Right pigtail pleural catheter is partially uncoiled with side ports external to the pleural space, within the soft tissues of the right lateral chest wall. Similar positioning to 1 day prior. 2. Suspect small residual right apical pneumothorax. Persistent small moderate right pleural effusion. 3. Increasing interstitial opacities bilaterally, concerning for developing edema. More heterogeneous airspace opacities elsewhere are unchanged.  Micro Data:    Antimicrobials:    623 Zithromax 623 8 Rocephin Interim history/subjective:  Not eating, more somnolent, itching but too weak to scratch her face. She has been too obtunded to swallow hydroxyzine. Her family is concerned that she may be in pain from periodic moaning. She has been agitated trying to climb out of bed once. They do not have pleurX supplies at home to be able to leave today with hospice but understand that she may pass soon in the hospital.  Objective   Blood pressure (!) 87/32, pulse 97, temperature 98.2 F (36.8 C), temperature source Axillary, resp. rate 14, weight 66.4 kg, SpO2 100 %.       No intake or output data in the 24 hours ending 05/25/20 1404 Filed Weights   05/06/2020  0430 05/24/20 0514 05/25/20 0413  Weight: 66.4 kg 65.6 kg 66.4 kg    Examination: Elderly woman laying in bed  somnolent. Very frail and ill appearing. [place]/AT, eyes rolled back in her head. Oral mucosa dry. Breathing reg rate, but using accessory muscles mildly. Not responsive to verbal stimulation.  Pallor Leg edema Chest tube tidaling, draining clear yellow fluid  CXR personally reviewed 6/27: minimal apical pnx, much improved from yesterday. Mild persistent layering effusion vs pulmonary edema  Resolved Hospital Problem list     Assessment & Plan:  Pneumothorax ex- vacuo post thoracentesis.  Acute on chronic hypoxic respiratory failure -6/23 patient underwent in office thoracentesis with 2L removed, on 6/23 it appears that the patient developed a pneumothorax ex vacuo post thoracentesis.  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 -discussed with Dr. Radene Gunning that her visitation should be liberalized to allow he children to stay overnight as she is likely to pass away today or tonight. -no additional labs or accuchecks.  Will update bedside RN.   Best practice:  Diet: Cardiac diet Pain/Anxiety/Delirium protocol (if indicated): PRNs VAP protocol (if indicated): No applicable  DVT prophylaxis: Sub heparin GI prophylaxis: PPI Glucose control: SSI Mobility: Up with assistance  Code Status: Full Family Communication: family updated at bedside Disposition: Floor  Labs   CBC: Recent Labs  Lab 05/03/2020 1500 05/22/20 0436 05/18/2020 0130 05/24/20 0338  WBC 17.8* 7.1 13.2* 12.1*  NEUTROABS  --   --   --  10.2*  HGB 10.8* 9.3* 9.5* 9.7*  HCT 33.5* 30.9* 30.1*  28.7*  MCV 98.8 107.3* 100.3* 96.6  PLT 129* 59* 96* 68*    Basic Metabolic Panel: Recent Labs  Lab 05/12/2020 1500 05/19/2020 1731 05/22/20 0436 05/22/2020 0130 05/24/20 0338  NA 139  --  140 135 134*  K 3.9  --  3.9 4.1 4.9  CL 108  --  110 104 105  CO2 18*  --  16* 16* 16*  GLUCOSE 92  --  100* 256* 227*  BUN 67*  --  73* 80* 92*  CREATININE 3.72*  --  3.81* 4.07* 4.80*  CALCIUM 8.3*  --  8.0* 7.8* 8.0*  MG  --  1.8  --  1.9  --   PHOS  --  4.6  --  5.8*  --    GFR: Estimated Creatinine Clearance: 9.2 mL/min (A) (by C-G formula based on SCr of 4.8 mg/dL (H)). Recent Labs  Lab 05/13/2020 1500 05/09/2020 1731 05/17/2020 1904 05/09/2020 2111 05/22/20 0436 05/22/20 1849 05/25/2020 0130 05/24/20 0338  PROCALCITON  --   --  1.24  --   --  1.21  --   --   WBC 17.8*  --   --   --  7.1  --  13.2* 12.1*  LATICACIDVEN  --  4.4*  --  2.1*  --   --   --   --     Liver Function Tests: Recent Labs  Lab 05/28/2020 1500 05/22/20 0436 05/11/2020 0130 05/24/20 0338  AST 39 33 40 33  ALT 16 18 17  16  ALKPHOS 76 59 62 54  BILITOT 3.4* 2.0* 1.9* 1.4*  PROT 5.1* 4.4* 4.2* 4.0*  ALBUMIN 1.9* 1.5* 1.5* 1.3*   No results for input(s): LIPASE, AMYLASE in the last 168 hours. Recent Labs  Lab 05/14/2020 0130  AMMONIA 31    ABG    Component Value Date/Time   PHART 7.293 (L) 05/28/2020 0102   PCO2ART 37.8 05/02/2020 0102   PO2ART 78.1 (L) 05/02/2020 0102   HCO3 17.7 (L) 05/14/2020 0102   ACIDBASEDEF 7.6 (H) 05/18/2020 0102   O2SAT 94.7 05/19/2020 0102     Coagulation Profile: Recent Labs  Lab 05/19/2020 1731  INR 1.9*    Cardiac Enzymes: No results for input(s): CKTOTAL, CKMB, CKMBINDEX, TROPONINI in the last 168 hours.  HbA1C: Hgb A1c MFr Bld  Date/Time Value Ref Range Status  04/16/2020 03:40 PM 5.6 4.6 - 6.5 % Final    Comment:    Glycemic Control Guidelines for People with Diabetes:Non Diabetic:  <6%Goal of Therapy: <7%Additional Action Suggested:  >8%     CBG: Recent Labs   Lab 05/07/2020 0817 05/15/2020 1219 05/02/2020 1641 05/10/2020 2135 05/24/20 0807  GLUCAP 185* 148* 169* 186* 177*     Signature:    Richardson Landry Minor ACNP Acute Care Nurse Practitioner [place] Please consult Amion 05/25/2020, 2:04 PM

## 2020-05-25 NOTE — Care Management (Addendum)
Spoke with [facility] RN, Ashlee Kelly, this morning.  Equipment was delivered last night or will be this morning.  Ashlee Kelly is updated on developments from yesterday and advised  discharge is delayed due to needed Pleurx placement.  Hospice arrangements are complete. Patient may discharge home when medically stable.

## 2020-05-26 DIAGNOSIS — R188 Other ascites: Secondary | ICD-10-CM

## 2020-05-26 DIAGNOSIS — E1122 Type 2 diabetes mellitus with diabetic chronic kidney disease: Secondary | ICD-10-CM

## 2020-05-26 DIAGNOSIS — J189 Pneumonia, unspecified organism: Secondary | ICD-10-CM

## 2020-05-26 DIAGNOSIS — I350 Nonrheumatic aortic (valve) stenosis: Secondary | ICD-10-CM

## 2020-05-26 DIAGNOSIS — E039 Hypothyroidism, unspecified: Secondary | ICD-10-CM

## 2020-05-26 DIAGNOSIS — K766 Portal hypertension: Secondary | ICD-10-CM

## 2020-05-26 DIAGNOSIS — R0603 Acute respiratory distress: Secondary | ICD-10-CM

## 2020-05-26 LAB — CULTURE, BLOOD (ROUTINE X 2)
Culture: NO GROWTH
Culture: NO GROWTH

## 2020-05-26 SURGERY — INSERTION, PLEURAL DRAINAGE CATHETER
Anesthesia: Moderate Sedation

## 2020-05-26 MED ORDER — LORAZEPAM 2 MG/ML IJ SOLN
0.5000 mg | INTRAMUSCULAR | Status: DC
  Filled 2020-05-26: qty 1

## 2020-05-26 MED ORDER — GLYCOPYRROLATE 0.2 MG/ML IJ SOLN: 0.2000 mg | INTRAMUSCULAR | Status: DC | PRN

## 2020-05-26 MED ORDER — DIPHENHYDRAMINE HCL 50 MG/ML IJ SOLN: 12.5000 mg | Freq: Four times a day (QID) | INTRAMUSCULAR | Status: DC | PRN

## 2020-05-26 MED ORDER — GLYCOPYRROLATE 0.2 MG/ML IJ SOLN
0.2000 mg | INTRAMUSCULAR | Status: DC
  Administered 2020-05-26: 0.2 mg via INTRAVENOUS
  Filled 2020-05-26 (×3): qty 1

## 2020-05-26 MED ORDER — HYDROXYZINE HCL 50 MG/ML IM SOLN
25.0000 mg | Freq: Three times a day (TID) | INTRAMUSCULAR | Status: DC | PRN
  Filled 2020-05-26: qty 0.5

## 2020-05-26 MED ORDER — GLYCOPYRROLATE 0.2 MG/ML IJ SOLN: 0.1000 mg | INTRAMUSCULAR | Status: DC

## 2020-05-26 MED ORDER — GLYCOPYRROLATE 0.2 MG/ML IJ SOLN
0.2000 mg | INTRAMUSCULAR | Status: DC | PRN
  Administered 2020-05-26: 0.2 mg via INTRAVENOUS

## 2020-05-26 MED ORDER — HYDROMORPHONE HCL 1 MG/ML IJ SOLN
1.0000 mg | INTRAMUSCULAR | Status: DC | PRN
  Administered 2020-05-26: 1 mg via INTRAVENOUS
  Filled 2020-05-26: qty 1

## 2020-05-26 MED ORDER — LORAZEPAM 2 MG/ML IJ SOLN
0.5000 mg | INTRAMUSCULAR | Status: DC | PRN
  Administered 2020-05-26: 0.5 mg via INTRAVENOUS

## 2020-05-26 MED ORDER — HYDROMORPHONE HCL 1 MG/ML IJ SOLN
1.0000 mg | INTRAMUSCULAR | Status: DC | PRN
  Administered 2020-05-26: 1 mg via INTRAVENOUS

## 2020-05-26 MED ORDER — DIPHENHYDRAMINE HCL 50 MG/ML IJ SOLN
12.5000 mg | Freq: Four times a day (QID) | INTRAMUSCULAR | Status: DC
  Administered 2020-05-26 (×2): 12.5 mg via INTRAVENOUS
  Filled 2020-05-26 (×2): qty 1

## 2020-05-26 MED ORDER — HYDROMORPHONE HCL 1 MG/ML IJ SOLN
1.0000 mg | INTRAMUSCULAR | Status: DC
  Administered 2020-05-26: 1 mg via INTRAVENOUS
  Filled 2020-05-26 (×3): qty 1

## 2020-05-29 NOTE — Progress Notes (Signed)
PROGRESS NOTE    Dalene Robards  ZSW:[id number]  DOB: [birth year]  PCP: Binnie Rail, MD Admit date:05/12/2020  Chief compliant:  Shortness of breath 79 y.o. 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 She had outpatient thoracentesis with her primary pulmonologist on 6/22 with 2 L of straw-colored fluid removed. Her chest x-ray showed bilateral infiltrates therefore Augmentin was prescribed.Post procedure patient experienced pain with inhalation, shortness of breath and cough resulting in stat chest x-ray.  Chest x-ray in office revealed a tiny right pneumothorax, patient was observed in clinic several hours with stable repeat chest x-ray therefore patient was discharged home.  However later that night she developed shortness of breath which got worse on the morning of admission.She had a follow up chest x-ray on 6/24 which showed mildly increased size of small right apical pneumothorax and increased moderate right pleural effusion.   ED Course: Afebrile.Patient tachycardic, tachypneic, hypoxic and oxygen saturation in 70s-placed on 4 L of oxygen via nasal cannula, chest x-ray shows right apical pneumothorax therefore pigtail chest tube inserted in ED.  Patient afebrile with leukocytosis and 17,000, platelet: 129, CMP shows worsening kidney function-creatinine elevated at 3.72 from baseline 2.44.  EDP consulted PCCM. Hospital course: Patient admitted to [facility] for further evaluation and management along with PCCM comanagement.  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  Her condition however worsened over the weekend and patient transitioned to full comfort measures on 6/27.  Chest tube remains in place-PCCM following. Patient appears terminal at this point and may pass tonight.  Defer to PCCM/palliative care team regarding timing of chest tube removal (family does not have any particular preference) Several family members at bedside today and are satisfied with current ongoing comfort measures.  Appreciate palliative care team follow-up.  They have adjusted comfort care medications today.  She is DNR.  Family does not wish to have autopsy upon patient's demise.  Family / Patient Communication: Discussed with sisters, son and daughter-in-law at bedside. Disposition Plan:   Status is: Inpatient  Remains inpatient appropriate because:Terminal condition   Dispo: The patient is from: Home              Anticipated d/c is to: Not applicable   Patient currently is not medically stable to d/c.    Subjective:  Patient on full comfort measures now.  Several family members at bedside.  Chest tube to right lateral chest wall  Objective: Vitals:   05/24/20 1420 05/24/20 1430 05/25/20 0413 05/25/20 1721  BP: (!) 88/46 (!) 116/52 (!) 87/32 (!) 57/20  Pulse: (!) 109 (!) 109 97 (!) 102  Resp: 20 17 14 18   Temp:   98.2 F (36.8 C) 98.5 F (36.9 C)  TempSrc:   Axillary Axillary  SpO2: 92% 91% 100% 100%  Weight:   66.4 kg    No intake  or output data in the 24 hours ending 06-03-20 1813 Filed Weights   05/14/2020 0430 05/24/20 0514 05/25/20 0413   Weight: 66.4 kg 65.6 kg 66.4 kg    Physical Examination: General: Cachectic, appears to be in terminal state with agonal breathing  Heart: S1-S2 heard, regular rate and rhythm, no murmurs.  No leg edema noted Lungs: Right lateral chest wall tube to suction, mild coarse upper airway sounds  abdomen: Bowel sounds heard, soft, nontender, nondistended.  Extremities: No pedal edema.  No cyanosis or clubbing. Neurological: Lethargic/terminal with agonal breathing.  Cannot assess further     Data Reviewed: I have personally reviewed following labs and imaging studies  CBC: Recent Labs  Lab 05/18/2020 1500 05/22/20 0436 05/13/2020 0130 05/24/20 0338  WBC 17.8* 7.1 13.2* 12.1*  NEUTROABS  --   --   --  10.2*  HGB 10.8* 9.3* 9.5* 9.7*  HCT 33.5* 30.9* 30.1* 28.7*  MCV 98.8 107.3* 100.3* 96.6  PLT 129* 59* 96* 68*   Basic Metabolic Panel: Recent Labs  Lab 05/14/2020 1500 05/28/2020 1731 05/22/20 0436 05/12/2020 0130 05/24/20 0338  NA 139  --  140 135 134*  K 3.9  --  3.9 4.1 4.9  CL 108  --  110 104 105  CO2 18*  --  16* 16* 16*  GLUCOSE 92  --  100* 256* 227*  BUN 67*  --  73* 80* 92*  CREATININE 3.72*  --  3.81* 4.07* 4.80*  CALCIUM 8.3*  --  8.0* 7.8* 8.0*  MG  --  1.8  --  1.9  --   PHOS  --  4.6  --  5.8*  --    GFR: Estimated Creatinine Clearance: 9.2 mL/min (A) (by C-G formula based on SCr of 4.8 mg/dL (H)). Liver Function Tests: Recent Labs  Lab 05/11/2020 1500 05/22/20 0436 05/04/2020 0130 05/24/20 0338  AST 39 33 40 33  ALT 16 18 17 16   ALKPHOS 76 59 62 54  BILITOT 3.4* 2.0* 1.9* 1.4*  PROT 5.1* 4.4* 4.2* 4.0*  ALBUMIN 1.9* 1.5* 1.5* 1.3*   No results for input(s): LIPASE, AMYLASE in the last 168 hours. Recent Labs  Lab 05/16/2020 0130  AMMONIA 31   Coagulation Profile: Recent Labs  Lab 05/12/2020 1731  INR 1.9*   Cardiac Enzymes: No results for input(s): CKTOTAL, CKMB, CKMBINDEX, TROPONINI in the last 168 hours. BNP (last 3 results) No results for  input(s): PROBNP in the last 8760 hours. HbA1C: No results for input(s): HGBA1C in the last 72 hours. CBG: Recent Labs  Lab 05/04/2020 0817 05/14/2020 1219 05/07/2020 1641 05/05/2020 2135 05/24/20 0807  GLUCAP 185* 148* 169* 186* 177*   Lipid Profile: No results for input(s): CHOL, HDL, LDLCALC, TRIG, CHOLHDL, LDLDIRECT in the last 72 hours. Thyroid Function Tests: No results for input(s): TSH, T4TOTAL, FREET4, T3FREE, THYROIDAB in the last 72 hours. Anemia Panel: No results for input(s): VITAMINB12, FOLATE, FERRITIN, TIBC, IRON, RETICCTPCT in the last 72 hours. Sepsis Labs: Recent Labs  Lab 05/05/2020 1731 05/22/2020 1904 05/02/2020 2111 05/22/20 1849  PROCALCITON  --  1.24  --  1.21  LATICACIDVEN 4.4*  --  2.1*  --     Recent Results (from the past 240 hour(s))  Culture, blood (routine x 2)     Status: None   Collection Time: 05/20/2020  5:29 PM   Specimen: BLOOD  Result Value Ref Range Status   Specimen Description BLOOD LEFT ANTECUBITAL  Final   Special Requests  Final    BOTTLES DRAWN AEROBIC AND ANAEROBIC Blood Culture results may not be optimal due to an inadequate volume of blood received in culture bottles   Culture   Final    NO GROWTH 5 DAYS Performed at [facility], [place] [address], [place], [place] [address]    Report Status 13-Jun-2020 FINAL  Final  SARS Coronavirus 2 by RT PCR (hospital order, performed in [facility] hospital lab) Nasopharyngeal Nasopharyngeal Swab     Status: None   Collection Time: 05/05/2020  5:39 PM   Specimen: Nasopharyngeal Swab  Result Value Ref Range Status   SARS Coronavirus 2 NEGATIVE NEGATIVE Final    Comment: (NOTE) SARS-CoV-2 target nucleic acids are NOT DETECTED.  The SARS-CoV-2 RNA is generally detectable in upper and lower respiratory specimens during the acute phase of infection. The lowest concentration of SARS-CoV-2 viral copies this assay can detect is 250 copies / mL. A negative result does not preclude SARS-CoV-2  infection and should not be used as the sole basis for treatment or other patient management decisions.  A negative result may occur with improper specimen collection / handling, submission of specimen other than nasopharyngeal swab, presence of viral mutation(s) within the areas targeted by this assay, and inadequate number of viral copies (<250 copies / mL). A negative result must be combined with clinical observations, patient history, and epidemiological information.  Fact Sheet for Patients:   StrictlyIdeas.no  Fact Sheet for Healthcare Providers: BankingDealers.co.za  This test is not yet approved or  cleared by the [place] FDA and has been authorized for detection and/or diagnosis of SARS-CoV-2 by FDA under an Emergency Use Authorization (EUA).  This EUA will remain in effect (meaning this test can be used) for the duration of the COVID-19 declaration under Section 564(b)(1) of the Act, 21 U.S.C. section 360bbb-3(b)(1), unless the authorization is terminated or revoked sooner.  Performed at [facility], [place] [address] [place], [place] [address]   Culture, blood (routine x 2)     Status: None   Collection Time: 05/28/2020  6:00 PM   Specimen: BLOOD LEFT ARM  Result Value Ref Range Status   Specimen Description BLOOD LEFT ARM  Final   Special Requests   Final    BOTTLES DRAWN AEROBIC ONLY Blood Culture results may not be optimal due to an inadequate volume of blood received in culture bottles   Culture   Final    NO GROWTH 5 DAYS Performed at [facility], [place] [address] [place],  [address]    Report Status 06-13-2020 FINAL  Final      Radiology Studies: DG CHEST PORT 1 VIEW  Result Date: 05/25/2020 CLINICAL DATA:  Short of breath.  Follow-up exam. EXAM: PORTABLE CHEST 1 VIEW COMPARISON:  05/24/2020 FINDINGS: Right-sided chest tube tip now projects in the medial right upper hemithorax. Right  hydropneumothorax has improved. Specifically, there is only a tiny sliver of residual apical pneumothorax. Pleural fluid at the base obscuring hemidiaphragm is similar to the prior exam. Bilateral interstitial and hazy airspace lung opacities are unchanged. No new lung abnormalities. No convincing left pleural effusion and no left pneumothorax. IMPRESSION: 1. Interval improvement with repositioning of the right chest tube. Only a sliver of right apical pneumothorax persists. 2. No other change. Persistent right pleural effusion and bilateral interstitial and airspace lung opacities. Electronically Signed   By: Lajean Manes M.D.   On: 05/25/2020 09:01      Scheduled Meds: .  diphenhydrAMINE  12.5 mg Intravenous Q6H  . glycopyrrolate  0.2 mg Intravenous Q4H  .  HYDROmorphone (DILAUDID) injection  1 mg Intravenous Q3H  . LORazepam  0.5 mg Intravenous Q4H  . sodium chloride flush  10 mL Intracatheter Q8H   Continuous Infusions:       Time spent: 25 minutes     >50% time spent in discussions with care team and coordination of care.    Guilford Shi, MD [facility] Pager in [place]  If 7PM-7AM, please contact night-coverage [email or web address] 2020-06-25, 6:13 PM

## 2020-05-29 NOTE — Death Summary Note (Addendum)
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 Portal hypertension Hypothyroidism Diabetes mellitus Gout Depression DVT of axillary vein, chronic right Hypothyroidism Aortic valve stenosis, mild-mod Thrombocytopenia (HCC)       History of present illness:  79 y.o.femalewith medical history significant ofhypothyroidism, Nash cirrhosis, 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 She had outpatient thoracentesis with her primary pulmonologist on 6/22 with 2 L of straw-colored fluid removed.Her chest x-ray showed bilateral infiltrates therefore Augmentin was prescribed.Post procedure patient experienced pain with inhalation, shortness of breath and cough resulting in stat chest x-ray. Chest x-ray in office revealed a tiny right pneumothorax, patient was observed in clinic several hours with stable repeat chest x-ray therefore patient was discharged home. However later that night she developed shortness of breath which got worse on the morning of admission.Shehad a follow upchest x-ray on 6/24which showed mildly increased size of small right apical pneumothorax and increased moderate right pleural effusion.  ED Course: Afebrile.Patient tachycardic, tachypneic, hypoxic and oxygen saturation in 70s-placed on  4 L of oxygen via nasal cannula, chest x-ray shows right apical pneumothorax therefore pigtail chest tube inserted in ED. Patient afebrile with leukocytosis and 17,000, platelet: 129, CMP shows worsening kidney function-creatinine elevated at 3.72 from baseline 2.44. EDP consulted PCCM.  Hospital Course:  Patient admitted to [facility] for further evaluation and management along with PCCM comanagement. Patient underwent Pigtail chest tube placement by EDP 05/25/2023 and pleurX catheter placement by PCCM on 05/24/20. She was felt to have end-stage cirrhosis secondary to nonalcoholic fatty liver disease and with associated complications of portal HTN, thrombocytopenia, recurrent hepatic hydrothorax and possibly hepatorenal syndrome. Palliative care was consulted for care goal discussions.  Family met with palliative care team and initially plan to transfer to a hospice facility.  Her condition however worsened over the weekend and patient transitioned to full comfort measures on 6/27. Decision was made for inpatient hospice care as death seemed imminent with transport option to hospice home seeming unsafe/not feasible. PCCM and palliative care team followed up on patient today and optimized comfort care measures. Several family members (son, daughter in law, 2 sisters) were at bedside today and aware of her terminal state, they were satisfied with ongoing comfort measures. Family expressed their wish to defer autopsy upon patient's demise.  Informed by nurse that patient passed away this evening and remained comfortable at end of her life.    Time of death documented as 18:48 on 05/29/2020   Signed:  Guilford Shi  [facility] May 29, 2020, 9:04 PM

## 2020-05-29 NOTE — Progress Notes (Signed)
Palliative Medicine RN Note: Symptom check.  Rec'd a call from PMT NP Ihor Dow. She spoke with Ashlee Kelly son earlier and is concerned due to her audible distress in the background of the call. Megan ordered Dilaudid at that time and asked me to follow up this morning.  Upon my arrival, her son was still at the bedside, along with other family members. Ashlee Kelly has no moaning/groaning/grunting, but there is some labored breathing. Almost no audible secretions heard. Her left arm is weeping and has saturated the towel underneath; I changed this out.   Reviewed medication orders and MAR with family at bedside. We discussed the medications that have and haven't worked for her. Based on this discussion and my assessment, I obtained the following medication changes from PMT NP [place]:   1. Scheduled and PRN Dilaudid 2. Scheduled and PRN Lorazepam 3. Scheduled and PRN Benadryl 4. Changed Atarax to IM. This was ordered only if Benadryl is ineffective, as it can be given IM but not IV 5. Stop Fentanyl 6. Scheduled and PRN Robinul  At this time, Ashlee. Kelly's status is very tenuous. PMT does not feel that she is stable to transfer out to a hospice facility, and the discharge orders have already been d/c'd. I confirmed that Ashlee Kelly family has the PMT team contact information. We will follow for symptoms. If she survives the night, I will follow up in the morning.  Ashlee Skiff Agostino Gorin, RN, BSN, [facility] Palliative Medicine Team 06/01/20 11:12 AM Office [phone]

## 2020-05-29 NOTE — Progress Notes (Addendum)
Floor Coverage text paged to update on pt expiration and request to complete death certificate. Pt had just passed away. No noted pulse, no Heart beat appreciated, nor respiration appreciated. Pronounced expired with Vernie Shanks RN  And this RN . Dr. Earnest Conroy  Text paged via [place] indicating pt had just expired at [facility].Dr. Hilma Favors of Palliative care updated.

## 2020-05-29 NOTE — Progress Notes (Signed)
PMT chart review:  Patient declined over the weekend. Unstable to transfer home with hospice. Family aware of decline and that she will pass in the hospital. Comfort focused care plan and symptom management medications on MAR. Discussed with RN. Spoke with son, Ashlee Kelly via telephone to discuss care plan, medications, and unrestricted visitor policy. Patient heard moaning while this NP speaking with son. RN instructed to give prn dilaudid now. Emotional support provided to son. Answered questions.   Discussed with palliative RN, Threasa Beards who will follow this afternoon for symptoms and to ensure Ashlee Kelly is comfortable as she nears end-of-life.  NO CHARGE  Ihor Dow, [place], FNP-C Palliative Medicine Team  Phone: [phone] Fax: [phone]

## 2020-05-29 NOTE — Progress Notes (Signed)
NAME:  Ashlee Kelly, MRN:  [id number], DOB:  22-Aug-1941, LOS: 5 ADMISSION DATE:  05/04/2020, CONSULTATION DATE:  05/05/2020 REFERRING MD:  Dr. 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 Underwent outpatient thoracentesis with primary pulmonologist Dr. Vaughan Browner 6/22 seen with hypoxia and pneumothorax 6/23 resulting in pigtail chest tube insertions by ED provider and admission to [facility] service. PCCM consulted for chest tube management   History of present illness   Ashlee Kelly is a 79 year old female with a past medical history significant for Karlene Lineman, thyroid disease, thrombocytopenia, hypothyroidism, diabetes, cirrhosis, arthritis, and chronic kidney disease stage IV who presented to the emergency department today for hypoxia and pneumothorax.    On 6/23 patient presented to Dr. Vaughan Browner, primary pulmonologist for an urgent visit due to worsening dyspnea in office work-up revealed recurrent right hepatic hydropneumothorax in the setting of Nash cirrhosis.  Thoracentesis was performed in office with 2 L of straw-colored fluid removed.  Post procedure patient experienced pain with inhalation, shortness of breath and cough resulting in stat chest x-ray.  Chest x-ray in office revealed a tiny right pneumothorax, patient was observed in clinic several hours with stable repeat chest x-ray therefore patient was discharged home.   6/24 as instructed patient presented for repeat chest x-ray to evaluate pneumothorax which revealed mildly increased size of small right apical pneumothorax and increased moderate right pleural effusion.  This in combination with hypoxia with SPO2 sat of 70% resulted in placement of pigtail chest tube in ED by ED provider. On further evaluation it appears that the patient was experiencing a pneumothorax ex vacuo post thoracentesis.   Patient tolerated procedure well and was  admitted under hospitalist services.  PCCM consulted for chest tube management.   Past Medical History  Nash Thrombocytopenia Hypothyroidism Diabetes Cancer Chronic kidney disease stage 4  Significant Hospital Events   Admitted 6/23, pigtail chest tube placed in ED on admisison  6/27 comfort measures only Consults:  PCCM   Procedures:  Pigtail chest tube placement by EDP 6/23 pleurX R on 6/26   Significant Diagnostic Tests:  CXR 6/23 > 1. Mildly increased size of small right apical pneumothorax. 2. Increased moderate right pleural effusion. 3. Bilateral airspace disease with mild progression on the right. CXR 6/26 IMPRESSION: 1. Right pigtail pleural catheter is partially uncoiled with side ports external to the pleural space, within the soft tissues of the right lateral chest wall. Similar positioning to 1 day prior. 2. Suspect small residual right apical pneumothorax. Persistent small moderate right pleural effusion. 3. Increasing interstitial opacities bilaterally, concerning for developing edema. More heterogeneous airspace opacities elsewhere are unchanged.  Micro Data:    Antimicrobials:    623 Zithromax 623 8 Rocephin Interim history/subjective:  Not eating, more somnolent, itching but too weak to scratch her face. She has been too obtunded to swallow hydroxyzine. Her family is concerned that she may be in pain from periodic moaning. She has been agitated trying to climb out of bed once. They do not have pleurX supplies at home to be able to leave today with hospice but understand that she may pass soon in the hospital.  6/28 comatose, periodic moaning Objective   Blood pressure (!) 57/20, pulse (!) 102, temperature 98.5 F (36.9 C), temperature source Axillary, resp. rate 18, weight 66.4 kg, SpO2 100 %.       No intake or output data in the 24 hours  ending 06/23/2020 0846 Filed Weights   05/11/2020 0430 05/24/20 0514 05/25/20 0413  Weight: 66.4 kg 65.6 kg  66.4 kg    Examination: Elderly woman laying in bed, comatose Oral mucosa dry. Breathing appears comfortable Chest tube tidaling, draining clear yellow fluid  CXR reviewed 6/27: minimal apical pnx, much improved from yesterday. Mild persistent layering effusion vs pulmonary edema  Resolved Hospital Problem list     Assessment & Plan:  Pneumothorax ex- vacuo post thoracentesis.  Acute on chronic hypoxic respiratory failure -6/23 patient underwent in office thoracentesis with 2L removed, on 6/23 it appears that the patient developed a pneumothorax ex vacuo post thoracentesis.  CAP -Bilateral infiltrates seen on CXR  P: On fentanyl Comatose Significant will risk with trying to take patient home There is a risk that she may pass on the trip, family at bedside.  They live about 45 minutes away.  If risk is acceptable and family member may be able to travel with the patient in an ambulance-taking her home may be considered.  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 -discussed with Dr. Radene Gunning that her visitation should be liberalized to allow he children to stay overnight as she is likely to pass away today or tonight. -no additional labs or accuchecks.   Sherrilyn Rist, MD [place] PCCM Pager: [phone]

## 2020-05-29 DEATH — deceased

## 2020-06-24 ENCOUNTER — Ambulatory Visit: Payer: Medicare Other | Admitting: Nurse Practitioner

## 2020-06-25 ENCOUNTER — Ambulatory Visit: Payer: Medicare Other | Admitting: Internal Medicine

## 2020-06-30 ENCOUNTER — Ambulatory Visit: Payer: Medicare Other | Admitting: Pulmonary Disease

## 2020-07-30 ENCOUNTER — Telehealth: Payer: Self-pay | Admitting: Internal Medicine

## 2020-07-30 NOTE — Telephone Encounter (Signed)
New message:   Pt's son is calling and states that the pt received two bills for the date of service of 04/16/20. Pt had a 1 month appt with Dr. Quay Burow and a Medicare Wellness Visit with Ms. Hatfield. Pt son states that medicare is refusing to pay for both of these appts because the pt is only supposed to have 1 of these appts a year. He also states he has already spoke with [facility] and would only like to speak with someone in our office. Please advise.

## 2022-04-18 IMAGING — DX DG CHEST 1V PORT
1 series · 1 of 1 positions shown · non-contrast
Comparison: 05/21/2020, 05/20/2020

CLINICAL DATA: Follow-up pneumothorax

EXAM:
PORTABLE CHEST 1 VIEW

[chest ap]
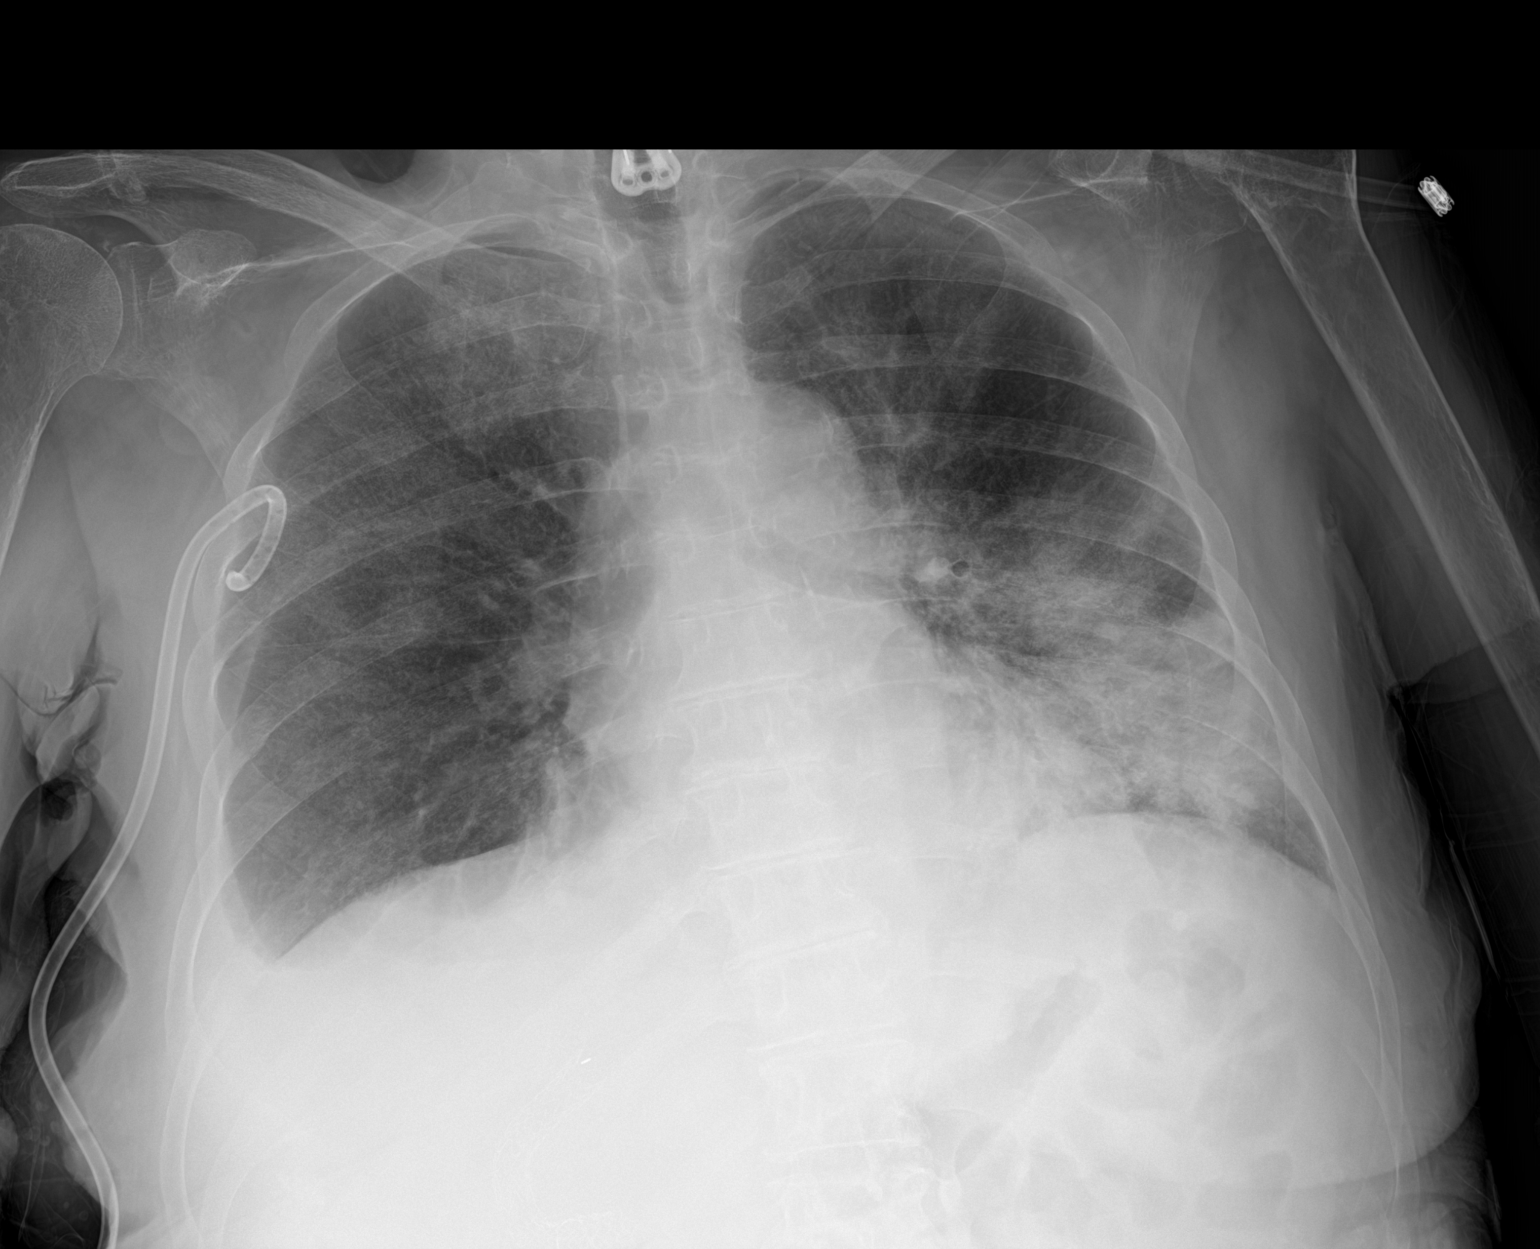

[1 of 1 positions shown; findings below may reference images not displayed]

FINDINGS: Interval insertion of right-sided chest drainage catheter, the
pigtail is at the periphery of the right mid lung. There is
decreased right pleural effusion with small residual. Right apical
pneumothorax is also decreased without definitive pleural line on
this exam. Worsening airspace disease in the left lower lung. Stable
cardiomediastinal silhouette.
IMPRESSION: 1. Interval insertion of right-sided chest drainage catheter with
pigtail at the periphery of the right mid lung, there is interval
decrease in right pleural effusion. Decreased right apical
pneumothorax without definitive pleural line on the current exam.
2. Worsening airspace disease in the left lower lung.

## 2022-04-20 IMAGING — CT CT HEAD W/O CM
4 series · 15 of 47 positions shown, 17 images · non-contrast
Comparison: None

CLINICAL DATA: TIA

EXAM:
CT HEAD WITHOUT CONTRAST
TECHNIQUE: Contiguous axial images were obtained from the base of the skull
through the vertex without intravenous contrast.

[Series 3: head wo · axial · 0.46mm/px · z∈[-142,-16]mm · 7 of 35 slices shown, 9 images]
[im 5/35  brain]
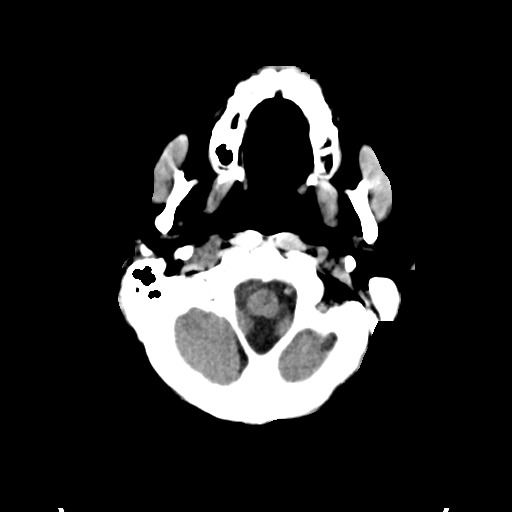
[im 5/35  bone]
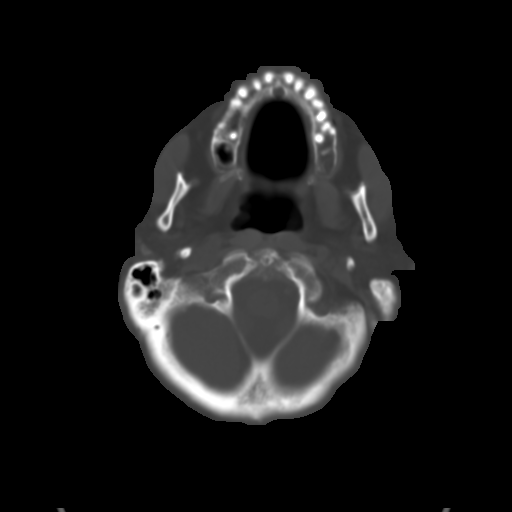
[im 9/35  brain]
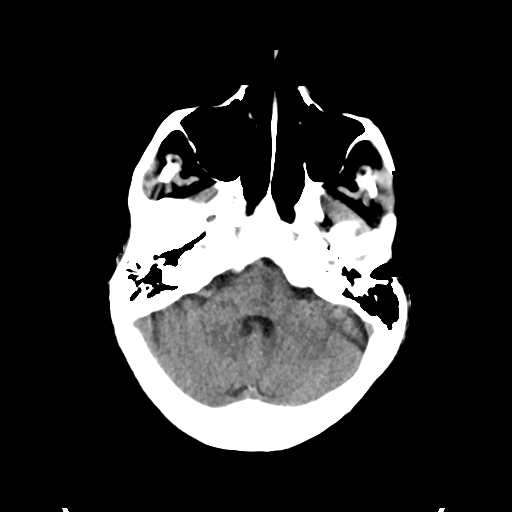
[im 13/35  brain]
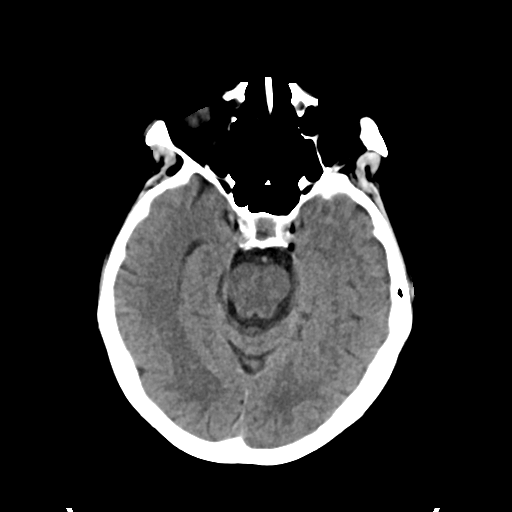
[im 18/35  brain]
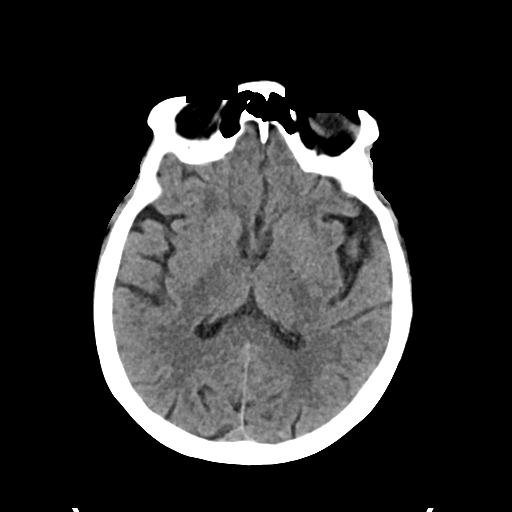
[im 22/35  brain]
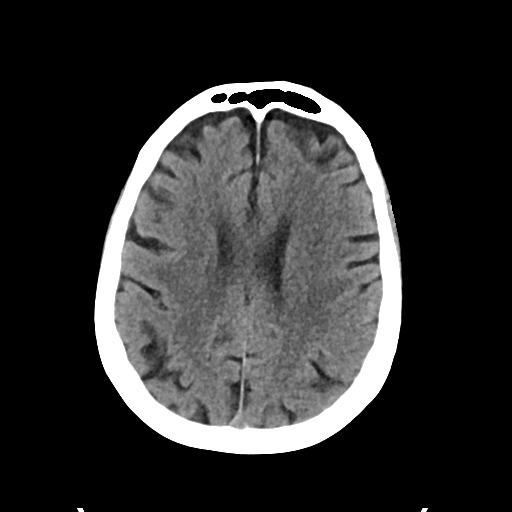
[im 22/35  bone]
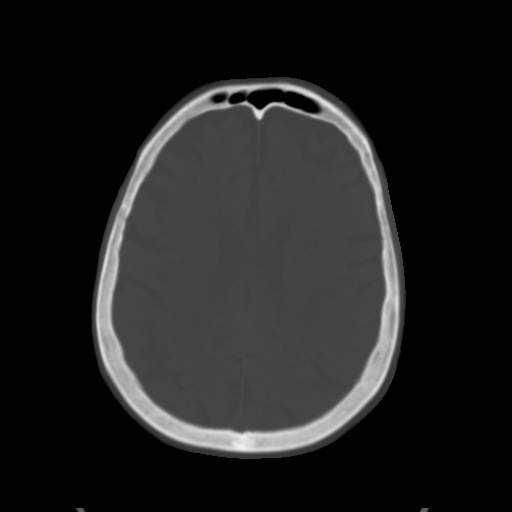
[im 26/35  brain]
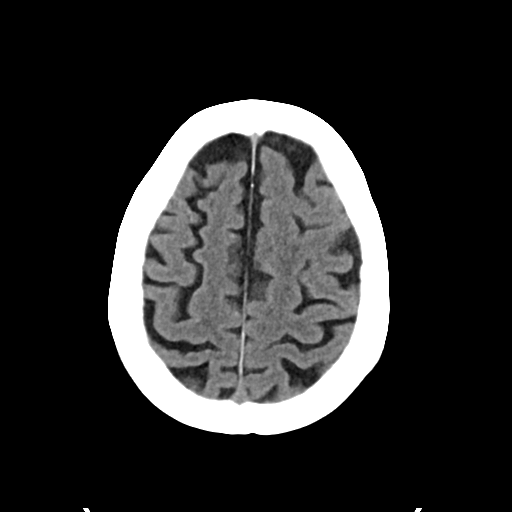
[im 30/35  brain]
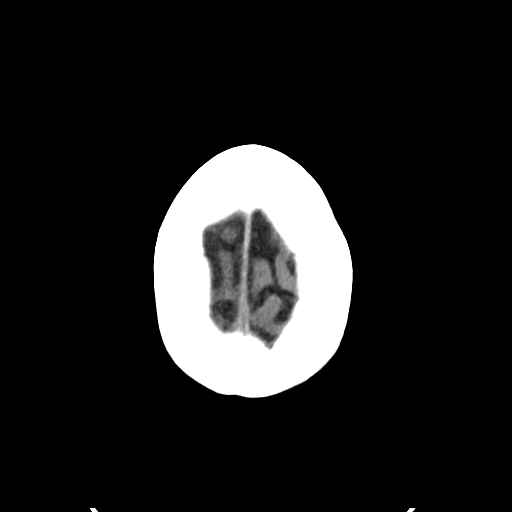

[Series 4: head bone · axial · 0.46mm/px · z∈[-146,-128]mm · 2 of 88 slices shown]
[im 9/88  bone]
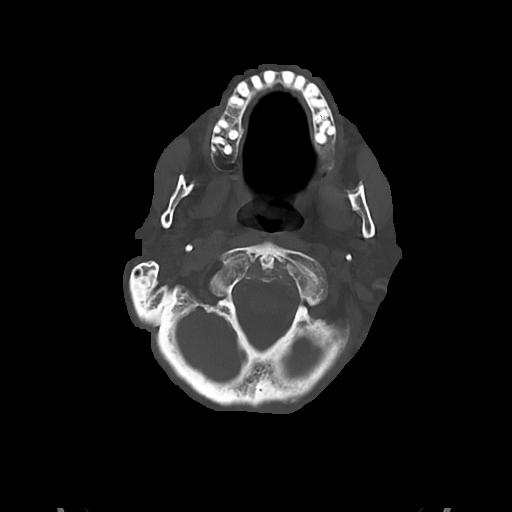
[im 18/88  bone]
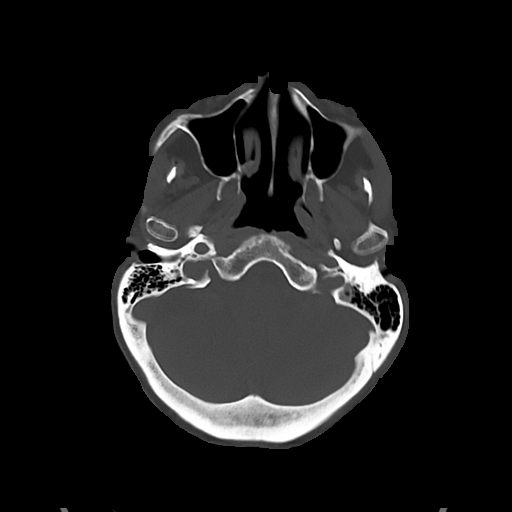

[Series 5: cor soft · coronal · 0.39mm/px · 3 of 81 slices shown]
[im 27/81  brain]
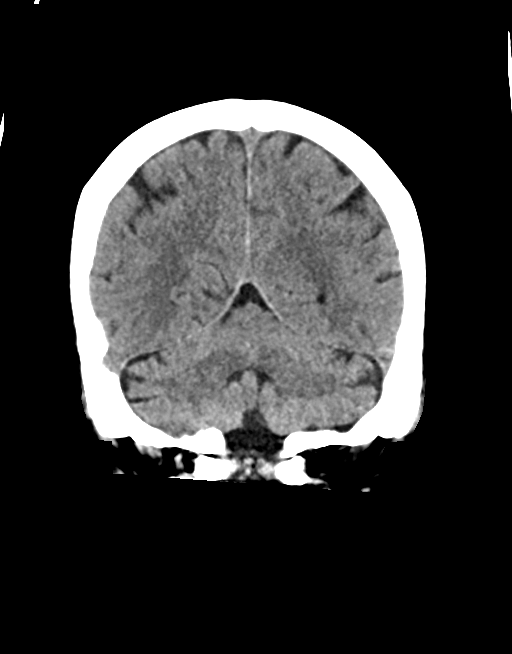
[im 36/81  brain]
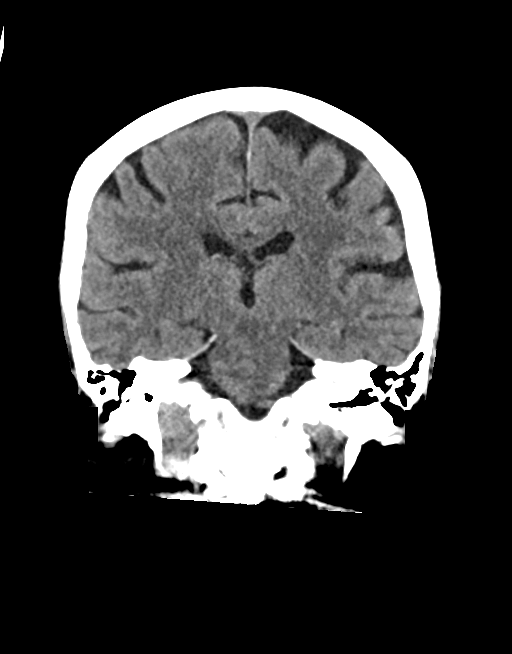
[im 45/81  brain]
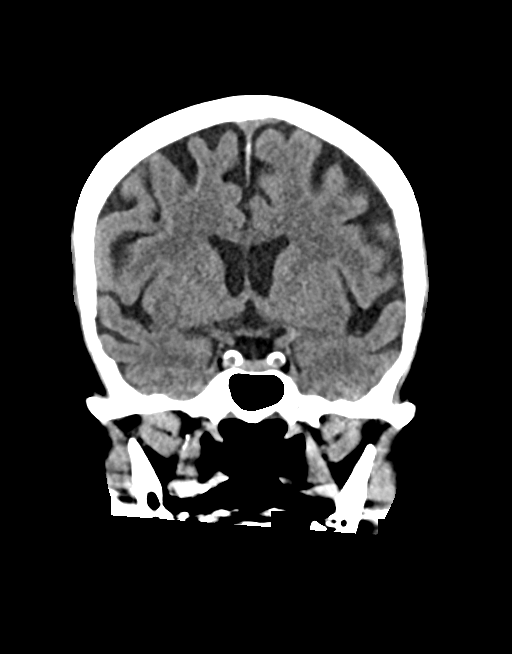

[Series 6: sag soft · sagittal · 0.41mm/px · 3 of 56 slices shown]
[im 19/56  brain]
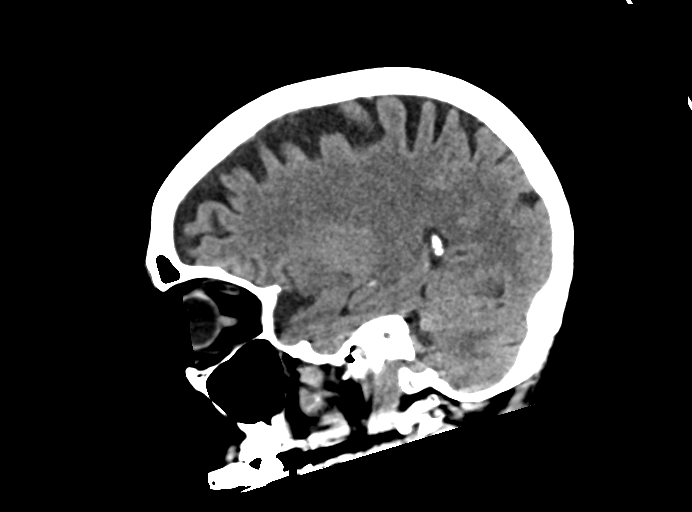
[im 28/56  brain]
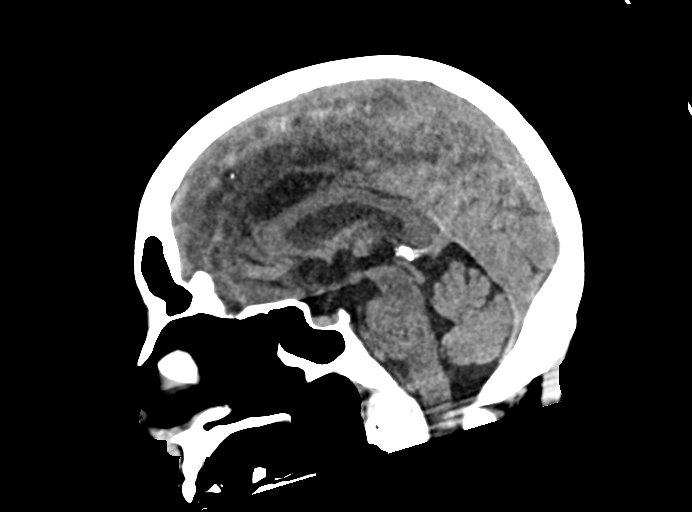
[im 37/56  brain]
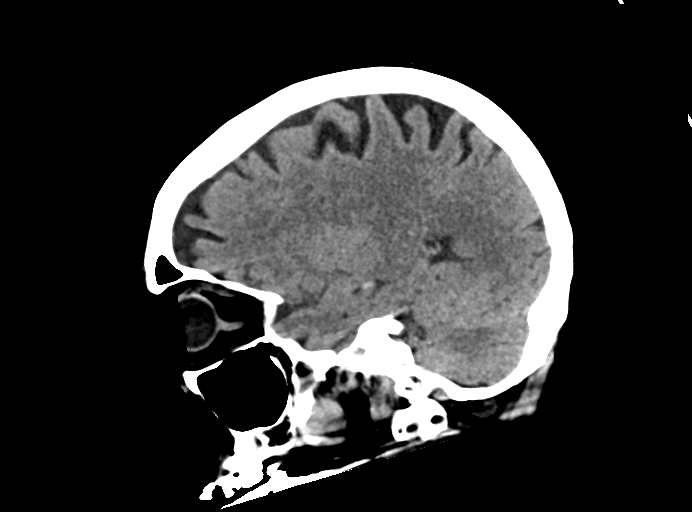

[15 of 47 positions shown; findings below may reference images not displayed]

FINDINGS: Brain: Some asymmetric hypoattenuation along the pons is likely
related to beam hardening across the skull base. No evidence of
acute infarction, hemorrhage, hydrocephalus, extra-axial collection
or mass lesion/mass effect. Symmetric prominence of the ventricles,
cisterns and sulci compatible with parenchymal volume loss. Patchy
areas of white matter hypoattenuation are most compatible with
chronic microvascular angiopathy.

Vascular: Atherosclerotic calcification of the carotid siphons. No
hyperdense vessel.

Skull: No calvarial fracture or suspicious osseous lesion. No scalp
swelling or hematoma.

Sinuses/Orbits: Paranasal sinuses and mastoid air cells are
predominantly clear. Orbital structures are unremarkable aside from
prior lens extractions.

Other: None
IMPRESSION: 1. No acute intracranial abnormality. If there is persisting concern
for acute infarction, MRI is more sensitive and specific for early
features of ischemia.
2. Some likely artifactual asymmetric hypoattenuation across the
pons though could correlate for clinical symptoms.
3. Mild parenchymal volume loss and chronic microvascular
angiopathy.
